# Patient Record
Sex: Female | Born: 1940 | Race: Black or African American | Hispanic: No | Smoking: Never smoker
Health system: Southern US, Community
[De-identification: ages and names within clinical notes are randomized; demographics above are authoritative.]

## PROBLEM LIST (undated history)

## (undated) DIAGNOSIS — M1990 Unspecified osteoarthritis, unspecified site: Secondary | ICD-10-CM

## (undated) DIAGNOSIS — C50912 Malignant neoplasm of unspecified site of left female breast: Secondary | ICD-10-CM

## (undated) DIAGNOSIS — G629 Polyneuropathy, unspecified: Secondary | ICD-10-CM

## (undated) DIAGNOSIS — E7800 Pure hypercholesterolemia, unspecified: Secondary | ICD-10-CM

## (undated) DIAGNOSIS — I509 Heart failure, unspecified: Secondary | ICD-10-CM

## (undated) DIAGNOSIS — I639 Cerebral infarction, unspecified: Secondary | ICD-10-CM

## (undated) DIAGNOSIS — E119 Type 2 diabetes mellitus without complications: Secondary | ICD-10-CM

## (undated) DIAGNOSIS — J449 Chronic obstructive pulmonary disease, unspecified: Secondary | ICD-10-CM

## (undated) DIAGNOSIS — Z9981 Dependence on supplemental oxygen: Secondary | ICD-10-CM

## (undated) DIAGNOSIS — I10 Essential (primary) hypertension: Secondary | ICD-10-CM

## (undated) DIAGNOSIS — K922 Gastrointestinal hemorrhage, unspecified: Secondary | ICD-10-CM

## (undated) HISTORY — PX: ABDOMINAL HYSTERECTOMY: SHX81

## (undated) HISTORY — PX: CATARACT EXTRACTION, BILATERAL: SHX1313

## (undated) HISTORY — PX: TONSILLECTOMY: SUR1361

## (undated) HISTORY — PX: MASTECTOMY: SHX3

## (undated) HISTORY — PX: BREAST BIOPSY: SHX20

---

## 2009-07-03 ENCOUNTER — Emergency Department (HOSPITAL_BASED_OUTPATIENT_CLINIC_OR_DEPARTMENT_OTHER): Admission: EM | Admit: 2009-07-03 | Discharge: 2009-07-04 | Payer: Self-pay | Admitting: Emergency Medicine

## 2009-07-04 ENCOUNTER — Ambulatory Visit: Payer: Self-pay | Admitting: Diagnostic Radiology

## 2010-05-16 LAB — POCT CARDIAC MARKERS
Myoglobin, poc: 66.3 ng/mL (ref 12–200)
Troponin i, poc: <0.05 ng/mL (ref 0.00–0.09)

## 2010-05-16 LAB — DIFFERENTIAL
Basophils Absolute: 0.2 10*3/uL — ABNORMAL HIGH (ref 0.0–0.1)
Basophils Relative: 3 % — ABNORMAL HIGH (ref 0–1)
Eosinophils Absolute: 0.1 10*3/uL (ref 0.0–0.7)
Lymphocytes Relative: 13 % (ref 12–46)

## 2010-05-16 LAB — CBC
HCT: 40.6 % (ref 36.0–46.0)
WBC: 6.4 10*3/uL (ref 4.0–10.5)

## 2010-05-16 LAB — BASIC METABOLIC PANEL
BUN: 24 mg/dL — ABNORMAL HIGH (ref 6–23)
CO2: 37 mEq/L — ABNORMAL HIGH (ref 19–32)
Calcium: 9 mg/dL (ref 8.4–10.5)
Creatinine, Ser: 0.9 mg/dL (ref 0.4–1.2)

## 2010-05-16 LAB — GLUCOSE, CAPILLARY: Glucose-Capillary: 137 mg/dL — ABNORMAL HIGH (ref 70–99)

## 2010-05-16 LAB — POCT B-TYPE NATRIURETIC PEPTIDE (BNP): B Natriuretic Peptide, POC: 13.9 pg/mL (ref 0–100)

## 2010-05-16 LAB — D-DIMER, QUANTITATIVE: D-Dimer, Quant: 0.72 ug/mL-FEU — ABNORMAL HIGH (ref 0.00–0.48)

## 2010-11-09 ENCOUNTER — Encounter: Payer: Self-pay | Admitting: *Deleted

## 2010-11-09 ENCOUNTER — Emergency Department (INDEPENDENT_AMBULATORY_CARE_PROVIDER_SITE_OTHER): Payer: Medicare Other

## 2010-11-09 ENCOUNTER — Other Ambulatory Visit: Payer: Self-pay

## 2010-11-09 ENCOUNTER — Emergency Department (HOSPITAL_BASED_OUTPATIENT_CLINIC_OR_DEPARTMENT_OTHER)
Admission: EM | Admit: 2010-11-09 | Discharge: 2010-11-09 | Disposition: A | Discharge status: Home / Self Care | Payer: Medicare Other | Attending: Emergency Medicine | Admitting: Emergency Medicine

## 2010-11-09 DIAGNOSIS — R0609 Other forms of dyspnea: Secondary | ICD-10-CM | POA: Insufficient documentation

## 2010-11-09 DIAGNOSIS — R0602 Shortness of breath: Secondary | ICD-10-CM

## 2010-11-09 DIAGNOSIS — I10 Essential (primary) hypertension: Secondary | ICD-10-CM | POA: Insufficient documentation

## 2010-11-09 DIAGNOSIS — R599 Enlarged lymph nodes, unspecified: Secondary | ICD-10-CM

## 2010-11-09 DIAGNOSIS — R0989 Other specified symptoms and signs involving the circulatory and respiratory systems: Secondary | ICD-10-CM

## 2010-11-09 DIAGNOSIS — J45909 Unspecified asthma, uncomplicated: Secondary | ICD-10-CM | POA: Insufficient documentation

## 2010-11-09 DIAGNOSIS — I517 Cardiomegaly: Secondary | ICD-10-CM

## 2010-11-09 DIAGNOSIS — M7989 Other specified soft tissue disorders: Secondary | ICD-10-CM | POA: Insufficient documentation

## 2010-11-09 DIAGNOSIS — Z853 Personal history of malignant neoplasm of breast: Secondary | ICD-10-CM | POA: Insufficient documentation

## 2010-11-09 DIAGNOSIS — E119 Type 2 diabetes mellitus without complications: Secondary | ICD-10-CM | POA: Insufficient documentation

## 2010-11-09 DIAGNOSIS — R609 Edema, unspecified: Secondary | ICD-10-CM

## 2010-11-09 HISTORY — DX: Essential (primary) hypertension: I10

## 2010-11-09 LAB — DIFFERENTIAL
Basophils Absolute: 0 10*3/uL (ref 0.0–0.1)
Basophils Relative: 0 % (ref 0–1)
Lymphocytes Relative: 14 % (ref 12–46)
Lymphs Abs: 1 10*3/uL (ref 0.7–4.0)
Monocytes Relative: 10 % (ref 3–12)
Neutro Abs: 5.2 10*3/uL (ref 1.7–7.7)

## 2010-11-09 LAB — CBC
MCH: 26.9 pg (ref 26.0–34.0)
RDW: 14.4 % (ref 11.5–15.5)
WBC: 7 10*3/uL (ref 4.0–10.5)

## 2010-11-09 LAB — BASIC METABOLIC PANEL
Calcium: 9.5 mg/dL (ref 8.4–10.5)
Creatinine, Ser: 0.9 mg/dL (ref 0.50–1.10)
Sodium: 141 mEq/L (ref 135–145)

## 2010-11-09 MED ORDER — IOHEXOL 350 MG/ML SOLN
80.0000 mL | Freq: Once | INTRAVENOUS | Status: AC | PRN
  Administered 2010-11-09: 80 mL via INTRAVENOUS

## 2010-11-09 NOTE — ED Notes (Signed)
Pt placed on 4l/m [place] with relief of sob. Continuous po. Placed on right index finger. sats 96% on 4LN/C.

## 2010-11-09 NOTE — ED Notes (Signed)
Pt c/o leg swelling that has gotten worse since last week. Pt sts she has seen her PMD for same but he didn't do anything for her or change her fluid pills. She sts her feet get darker in color aty night. Pt also reports a dizzy spell with SOB this a.m.

## 2010-11-09 NOTE — ED Notes (Signed)
Patient stated she is normally on home 02 @ 2ltrs/min. So nurse turned 02 down from 4 to 2 ltrs.

## 2010-11-09 NOTE — ED Provider Notes (Signed)
History     CSN: [id number] Arrival date & time: 11/09/2010  1:11 PM   Chief Complaint  Patient presents with  . Leg Swelling     (Include location/radiation/quality/duration/timing/severity/associated sxs/prior treatment) HPI   Past Medical History  Diagnosis Date  . Diabetes mellitus   . Hypertension   . Breast cancer   . Asthma      Past Surgical History  Procedure Date  . Cesarean section   . Abdominal hysterectomy   . Mastectomy     No family history on file.  History  Substance Use Topics  . Smoking status: Never Smoker   . Smokeless tobacco: Not on file  . Alcohol Use: No    OB History    Grav Para Term Preterm Abortions TAB SAB Ect Mult Living                  Review of Systems  Allergies  Review of patient's allergies indicates no known allergies.  Home Medications  No current outpatient prescriptions on file.  Physical Exam    BP 159/88  Pulse 82  Temp(Src) 98.9 F (37.2 C) (Oral)  Resp 21  SpO2 96%  Physical Exam  ED Course  Procedures  Results for orders placed during the hospital encounter of 11/09/10  CBC      Component Value Range   WBC 7.0  4.0 - 10.5 (K/uL)   RBC 4.53  3.87 - 5.11 (MIL/uL)   Hemoglobin 12.2  12.0 - 15.0 (g/dL)   HCT 30.8  65.7 - 84.6 (%)   MCV 88.7  78.0 - 100.0 (fL)   MCH 26.9  26.0 - 34.0 (pg)   MCHC 30.3  30.0 - 36.0 (g/dL)   RDW 96.2  95.2 - 84.1 (%)   Platelets 169  150 - 400 (K/uL)  DIFFERENTIAL      Component Value Range   Neutrophils Relative 74  43 - 77 (%)   Neutro Abs 5.2  1.7 - 7.7 (K/uL)   Lymphocytes Relative 14  12 - 46 (%)   Lymphs Abs 1.0  0.7 - 4.0 (K/uL)   Monocytes Relative 10  3 - 12 (%)   Monocytes Absolute 0.7  0.1 - 1.0 (K/uL)   Eosinophils Relative 1  0 - 5 (%)   Eosinophils Absolute 0.1  0.0 - 0.7 (K/uL)   Basophils Relative 0  0 - 1 (%)   Basophils Absolute 0.0  0.0 - 0.1 (K/uL)  BASIC METABOLIC PANEL      Component Value Range   Sodium 141  135 - 145 (mEq/L)   Potassium 4.7  3.5 - 5.1 (mEq/L)   Chloride 97  96 - 112 (mEq/L)   CO2 41 (*) 19 - 32 (mEq/L)   Glucose, Bld 98  70 - 99 (mg/dL)   BUN 19  6 - 23 (mg/dL)   Creatinine, Ser 3.24  0.50 - 1.10 (mg/dL)   Calcium 9.5  8.4 - 40.1 (mg/dL)   GFR calc non Af Amer >60  >60 (mL/min)   GFR calc Af Amer >60  >60 (mL/min)   Dg Chest 2 View  11/09/2010  *RADIOLOGY REPORT*  Clinical Data: Short of breath.  History breast cancer  CHEST - 2 VIEW  Comparison: CT 07/04/2009, chest radiograph 07/04/2009  Findings: Cardiac silhouette is enlarged compared to prior.  Aorta is ectatic.  There is increased fullness within the right hilum. Additionally, there is a 10  millimeter right suprahilar density. Lateral projection is hypoventilatory.  Degenerative  osteophytosis of the thoracic spine.  IMPRESSION:  1.  Increased fullness in the right hila and right suprahilar density.  Recommend CT thorax with contrast to evaluate for adenopathy or mass. 2.  Interval increase in cardiac silhouette.  Original Report Authenticated By: Genevive Bi, M.D.   Ct Angio Chest W/cm &/or Wo Cm  11/09/2010  *RADIOLOGY REPORT*  Clinical Data:  Dyspnea, history of breast cancer, evaluate for PE  CT ANGIOGRAPHY CHEST WITH CONTRAST  Technique:  Multidetector CT imaging of the chest was performed using the standard protocol during bolus administration of intravenous contrast.  Multiplanar CT image reconstructions including MIPs were obtained to evaluate the vascular anatomy.  Contrast:  80 ml Omnipaque-300 IV  Comparison:  Chest radiographs dated 11/09/2010.  CT chest dated 07/04/2009.  Findings:  No evidence of pulmonary embolism.  Minimal patchy atelectasis / scarring in the lingula and at the right lung base. Previously visualized pulmonary nodules suspicious for metastases have essentially resolved.  Three tiny 2-3 mm pulmonary nodules in the right upper lobe (series 6/image 48) right lower lobe (series 6/image 57) and left lung base (series  6/image 79).  No pleural effusion or pneumothorax.  The heart is mildly enlarged.  No pericardial effusion.  Right hilar/mediastinal lymphadenopathy, as follows: --2.5 x 1.8 cm right superior hilar node (series 4/image 42), previously 2.2 x 1.5 cm --1.3 x 1.9 cm inferior right hilar node (series 4/image 53), previously 1.3 x 2.0 cm --1.2 x 1.8 cm subcarinal lymph node (series 4/image 50), previously 1.3 x 2.0 cm  Visualized upper abdomen is unremarkable.  Degenerative changes of the visualized thoracolumbar spine with stable generalized sclerosis.  Review of the MIP images confirms the above findings.  IMPRESSION: No evidence of pulmonary embolism.  Mediastinal/right hilar lymphadenopathy, as described above, grossly unchanged from 06/2009.  Previously visualized pulmonary nodules suspicious for metastases have essentially resolved.  Original Report Authenticated By: Charline Bills, M.D.   US Venous Img Lower Unilateral Right  11/09/2010  *RADIOLOGY REPORT*  Clinical Data: Right leg swelling  RIGHT LOWER EXTREMITY VENOUS DUPLEX ULTRASOUND  Technique:  Gray-scale sonography with graded compression, as well as color Doppler and duplex ultrasound, were performed to evaluate the deep venous system of the lower extremity from the level of the common femoral vein through the popliteal and proximal calf veins. Spectral Doppler was utilized to evaluate flow at rest and with distal augmentation maneuvers.  Comparison:  None.  Findings: Visualized right lower extremity deep venous system appears patent.  Normal compressibility.  Patent color Doppler flow.  Satisfactory spectral Doppler with respiratory variation and response to augmentation.  Peroneal vein in the calf is not discretely seen.  Greater saphenous vein, where visualized, is patent and compressible.  IMPRESSION: No deep venous thrombosis in the visualized right lower extremity.  Original Report Authenticated By: Charline Bills, M.D.     1. Peripheral  edema      MDM Patient was awaiting CT results at time of shift turnover. Her CT does not demonstrate any acute pulmonary embolus or other acute pathology. The enlarged lymph nodes are actually decreased in size with the same size from a year ago. I discussed these results with the patient and she is in no respiratory distress feels well and will be discharged home.       Nat Christen, MD 11/09/10 1620

## 2010-11-09 NOTE — ED Notes (Signed)
Co 2 41 noted by nurse and MD.

## 2010-11-09 NOTE — ED Provider Notes (Addendum)
History     CSN: [id number] Arrival date & time: 11/09/2010  1:11 PM   Chief Complaint  Patient presents with  . Leg Swelling     (Include location/radiation/quality/duration/timing/severity/associated sxs/prior treatment) The history is provided by the patient.   Patient with a known history of diabetes hypertension breast cancer asthma and previous stroke with residual left-sided bodily weakness. Presents with essentially chronic swelling of the lower extremities she states she is on a water pill at home patient states she saw her primary physician at the [facility] 2 weeks ago. The patient furthermore states her legs are turning darker at night but appeared to be more normal to her now she denies any fever or erythema. The patient is on home oxygen but feels more short of breath today. She denies any chest pain or pleuritic pain.  Past Medical History  Diagnosis Date  . Diabetes mellitus   . Hypertension   . Breast cancer   . Asthma      Past Surgical History  Procedure Date  . Cesarean section   . Abdominal hysterectomy   . Mastectomy     No family history on file.  History  Substance Use Topics  . Smoking status: Never Smoker   . Smokeless tobacco: Not on file  . Alcohol Use: No    OB History    Grav Para Term Preterm Abortions TAB SAB Ect Mult Living                  Review of Systems  All other systems reviewed and are negative.    Allergies  Review of patient's allergies indicates no known allergies.  Home Medications  No current outpatient prescriptions on file.  Physical Exam    BP 139/82  Pulse 81  Temp(Src) 98.1 F (36.7 C) (Oral)  Resp 22  SpO2 84%  Physical Exam  Constitutional: She is oriented to person, place, and time. She appears well-developed and well-nourished.  HENT:  Head: Normocephalic and atraumatic.  Eyes: Conjunctivae and EOM are normal. Pupils are equal, round, and reactive to light.  Neck: Neck supple.    Cardiovascular: Normal rate and regular rhythm.  Exam reveals no gallop and no friction rub.   No murmur heard. Pulmonary/Chest: Breath sounds normal. No respiratory distress. She has no wheezes. She has no rales. She exhibits no tenderness.  Abdominal: Soft. Bowel sounds are normal. She exhibits no distension. There is no tenderness. There is no rebound and no guarding.  Musculoskeletal: Normal range of motion. She exhibits edema.       2+ edema to the bilateral ankles which appears chronic there is no erythema distal pulses are easily palpable. There is no calf tenderness. There does appear to be some changes of venous stasis dermatitis in the lower extremities as well.  Neurological: She is alert and oriented to person, place, and time. No cranial nerve deficit. Coordination normal.  Skin: Skin is warm and dry. No rash noted.  Psychiatric: She has a normal mood and affect.    ED Course  Procedures  Results for orders placed during the hospital encounter of 07/03/09  BASIC METABOLIC PANEL      Component Value Range   Sodium 147 (*) 135 - 145 (mEq/L)   Potassium 4.1  3.5 - 5.1 (mEq/L)   Chloride 101  96 - 112 (mEq/L)   CO2 37 (*) 19 - 32 (mEq/L)   Glucose, Bld 136 (*) 70 - 99 (mg/dL)   BUN 24 (*)  6 - 23 (mg/dL)   Creatinine, Ser .9  0.4 - 1.2 (mg/dL)   Calcium 9.0  8.4 - 16.1 (mg/dL)   GFR calc non Af Amer >60  >60 (mL/min)   GFR calc Af Amer    >60 (mL/min)   Value: >60            The eGFR has been calculated     using the MDRD equation.     This calculation has not been     validated in all clinical     situations.     eGFR's persistently     <60 mL/min signify     possible Chronic Kidney Disease.  CBC      Component Value Range   WBC 6.4  4.0 - 10.5 (K/uL)   RBC 4.65  3.87 - 5.11 (MIL/uL)   Hemoglobin 13.0  12.0 - 15.0 (g/dL)   HCT 09.6  04.5 - 40.9 (%)   MCV 87.3  78.0 - 100.0 (fL)   MCHC 32.0  30.0 - 36.0 (g/dL)   RDW 81.1  91.4 - 78.2 (%)   Platelets 204  150 -  400 (K/uL)  DIFFERENTIAL      Component Value Range   Neutrophils Relative 73  43 - 77 (%)   Neutro Abs 4.7  1.7 - 7.7 (K/uL)   Lymphocytes Relative 13  12 - 46 (%)   Lymphs Abs 0.8  0.7 - 4.0 (K/uL)   Monocytes Relative 9  3 - 12 (%)   Monocytes Absolute 0.6  0.1 - 1.0 (K/uL)   Eosinophils Relative 2  0 - 5 (%)   Eosinophils Absolute 0.1  0.0 - 0.7 (K/uL)   Basophils Relative 3 (*) 0 - 1 (%)   Basophils Absolute 0.2 (*) 0.0 - 0.1 (K/uL)  D-DIMER, QUANTITATIVE      Component Value Range   D-Dimer, Quant   (*) 0.00 - 0.48 (ug/mL-FEU)   Value: 0.72            AT THE INHOUSE ESTABLISHED CUTOFF     VALUE OF 0.48 ug/mL FEU,     THIS ASSAY HAS BEEN DOCUMENTED     IN THE LITERATURE TO HAVE     A SENSITIVITY AND NEGATIVE     PREDICTIVE VALUE OF AT LEAST     98 TO 99%.  THE TEST RESULT     SHOULD BE CORRELATED WITH     AN ASSESSMENT OF THE CLINICAL     PROBABILITY OF DVT / VTE.  GLUCOSE, CAPILLARY      Component Value Range   Glucose-Capillary 137 (*) 70 - 99 (mg/dL)  POCT B-TYPE NATRIURETIC PEPTIDE (BNP)      Component Value Range   B Natriuretic Peptide, POC 13.9  0 - 100 (pg/mL)  POCT CARDIAC MARKERS      Component Value Range   Myoglobin, poc 66.3  12 - 200 (ng/mL)   CKMB, poc 1.0  1.0 - 8.0 (ng/mL)   Troponin i, poc <0.05  0.00 - 0.09 (ng/mL)   Comment       Value:            TROPONIN VALUES IN THE RANGE     OF 0.00-0.09 ng/mL SHOW     NO INDICATION OF     MYOCARDIAL INJURY.                PERSISTENTLY INCREASED TROPONIN     VALUES IN THE RANGE OF 0.10-0.24  ng/mL CAN BE SEEN IN:           -UNSTABLE ANGINA           -CONGESTIVE HEART FAILURE           -MYOCARDITIS           -CHEST TRAUMA           -ARRYHTHMIAS           -LATE PRESENTING MI           -COPD       CLINICAL FOLLOW-UP RECOMMENDED.                TROPONIN VALUES >=0.25 ng/mL     INDICATE POSSIBLE MYOCARDIAL     ISCHEMIA. SERIAL TESTING     RECOMMENDED.   No results found.   No diagnosis  found.   MDM Pt is seen and examined;  Initial history and physical completed.  Will follow.         Tramel Westbrook A. Patrica Duel, MD 11/09/10 1331  Petro Talent A. Schuyler Olden, MD 11/09/10 1333   Date: 11/09/2010  Rate: 77  Rhythm: normal sinus rhythm and sinus arrhythmia  QRS Axis: normal  Intervals: normal  ST/T Wave abnormalities: normal  Conduction Disutrbances:none  Narrative Interpretation:   Old EKG Reviewed: none available  Results for orders placed during the hospital encounter of 11/09/10  CBC      Component Value Range   WBC 7.0  4.0 - 10.5 (K/uL)   RBC 4.53  3.87 - 5.11 (MIL/uL)   Hemoglobin 12.2  12.0 - 15.0 (g/dL)   HCT 45.4  09.8 - 11.9 (%)   MCV 88.7  78.0 - 100.0 (fL)   MCH 26.9  26.0 - 34.0 (pg)   MCHC 30.3  30.0 - 36.0 (g/dL)   RDW 14.7  82.9 - 56.2 (%)   Platelets 169  150 - 400 (K/uL)  DIFFERENTIAL      Component Value Range   Neutrophils Relative 74  43 - 77 (%)   Neutro Abs 5.2  1.7 - 7.7 (K/uL)   Lymphocytes Relative 14  12 - 46 (%)   Lymphs Abs 1.0  0.7 - 4.0 (K/uL)   Monocytes Relative 10  3 - 12 (%)   Monocytes Absolute 0.7  0.1 - 1.0 (K/uL)   Eosinophils Relative 1  0 - 5 (%)   Eosinophils Absolute 0.1  0.0 - 0.7 (K/uL)   Basophils Relative 0  0 - 1 (%)   Basophils Absolute 0.0  0.0 - 0.1 (K/uL)  BASIC METABOLIC PANEL      Component Value Range   Sodium 141  135 - 145 (mEq/L)   Potassium 4.7  3.5 - 5.1 (mEq/L)   Chloride 97  96 - 112 (mEq/L)   CO2 41 (*) 19 - 32 (mEq/L)   Glucose, Bld 98  70 - 99 (mg/dL)   BUN 19  6 - 23 (mg/dL)   Creatinine, Ser 1.30  0.50 - 1.10 (mg/dL)   Calcium 9.5  8.4 - 86.5 (mg/dL)   GFR calc non Af Amer >60  >60 (mL/min)   GFR calc Af Amer >60  >60 (mL/min)   Dg Chest 2 View  11/09/2010  *RADIOLOGY REPORT*  Clinical Data: Short of breath.  History breast cancer  CHEST - 2 VIEW  Comparison: CT 07/04/2009, chest radiograph 07/04/2009  Findings: Cardiac silhouette is enlarged compared to prior.  Aorta is ectatic.  There is  increased fullness within the right hilum. Additionally,  there is a 10  millimeter right suprahilar density. Lateral projection is hypoventilatory.  Degenerative osteophytosis of the thoracic spine.  IMPRESSION:  1.  Increased fullness in the right hila and right suprahilar density.  Recommend CT thorax with contrast to evaluate for adenopathy or mass. 2.  Interval increase in cardiac silhouette.  Original Report Authenticated By: Genevive Bi, M.D.    2:32 PM Chest x-ray reviewed. CT angiogram of chest has been ordered for further evaluation. DVT of the right lower extremity is negative.   Kiarrah Rausch A. Patrica Duel, MD 11/09/10 1434  Results for orders placed during the hospital encounter of 11/09/10  CBC      Component Value Range   WBC 7.0  4.0 - 10.5 (K/uL)   RBC 4.53  3.87 - 5.11 (MIL/uL)   Hemoglobin 12.2  12.0 - 15.0 (g/dL)   HCT 40.9  81.1 - 91.4 (%)   MCV 88.7  78.0 - 100.0 (fL)   MCH 26.9  26.0 - 34.0 (pg)   MCHC 30.3  30.0 - 36.0 (g/dL)   RDW 78.2  95.6 - 21.3 (%)   Platelets 169  150 - 400 (K/uL)  DIFFERENTIAL      Component Value Range   Neutrophils Relative 74  43 - 77 (%)   Neutro Abs 5.2  1.7 - 7.7 (K/uL)   Lymphocytes Relative 14  12 - 46 (%)   Lymphs Abs 1.0  0.7 - 4.0 (K/uL)   Monocytes Relative 10  3 - 12 (%)   Monocytes Absolute 0.7  0.1 - 1.0 (K/uL)   Eosinophils Relative 1  0 - 5 (%)   Eosinophils Absolute 0.1  0.0 - 0.7 (K/uL)   Basophils Relative 0  0 - 1 (%)   Basophils Absolute 0.0  0.0 - 0.1 (K/uL)  BASIC METABOLIC PANEL      Component Value Range   Sodium 141  135 - 145 (mEq/L)   Potassium 4.7  3.5 - 5.1 (mEq/L)   Chloride 97  96 - 112 (mEq/L)   CO2 41 (*) 19 - 32 (mEq/L)   Glucose, Bld 98  70 - 99 (mg/dL)   BUN 19  6 - 23 (mg/dL)   Creatinine, Ser 0.86  0.50 - 1.10 (mg/dL)   Calcium 9.5  8.4 - 57.8 (mg/dL)   GFR calc non Af Amer >60  >60 (mL/min)   GFR calc Af Amer >60  >60 (mL/min)   Dg Chest 2 View  11/09/2010  *RADIOLOGY REPORT*  Clinical  Data: Short of breath.  History breast cancer  CHEST - 2 VIEW  Comparison: CT 07/04/2009, chest radiograph 07/04/2009  Findings: Cardiac silhouette is enlarged compared to prior.  Aorta is ectatic.  There is increased fullness within the right hilum. Additionally, there is a 10  millimeter right suprahilar density. Lateral projection is hypoventilatory.  Degenerative osteophytosis of the thoracic spine.  IMPRESSION:  1.  Increased fullness in the right hila and right suprahilar density.  Recommend CT thorax with contrast to evaluate for adenopathy or mass. 2.  Interval increase in cardiac silhouette.  Original Report Authenticated By: Genevive Bi, M.D.   US Venous Img Lower Unilateral Right  11/09/2010  *RADIOLOGY REPORT*  Clinical Data: Right leg swelling  RIGHT LOWER EXTREMITY VENOUS DUPLEX ULTRASOUND  Technique:  Gray-scale sonography with graded compression, as well as color Doppler and duplex ultrasound, were performed to evaluate the deep venous system of the lower extremity from the level of the common femoral vein through the popliteal and  proximal calf veins. Spectral Doppler was utilized to evaluate flow at rest and with distal augmentation maneuvers.  Comparison:  None.  Findings: Visualized right lower extremity deep venous system appears patent.  Normal compressibility.  Patent color Doppler flow.  Satisfactory spectral Doppler with respiratory variation and response to augmentation.  Peroneal vein in the calf is not discretely seen.  Greater saphenous vein, where visualized, is patent and compressible.  IMPRESSION: No deep venous thrombosis in the visualized right lower extremity.  Original Report Authenticated By: Charline Bills, M.D.      Izacc Demeyer A. Patrica Duel, MD 11/13/10 [phone]

## 2015-09-13 ENCOUNTER — Encounter (HOSPITAL_BASED_OUTPATIENT_CLINIC_OR_DEPARTMENT_OTHER): Payer: Self-pay | Admitting: *Deleted

## 2015-09-13 ENCOUNTER — Observation Stay (HOSPITAL_BASED_OUTPATIENT_CLINIC_OR_DEPARTMENT_OTHER)
Admission: EM | Admit: 2015-09-13 | Discharge: 2015-09-15 | Disposition: A | Discharge status: Home / Self Care | Payer: Medicare Other | Attending: Internal Medicine | Admitting: Internal Medicine

## 2015-09-13 ENCOUNTER — Emergency Department (HOSPITAL_BASED_OUTPATIENT_CLINIC_OR_DEPARTMENT_OTHER): Payer: Medicare Other

## 2015-09-13 DIAGNOSIS — Z7984 Long term (current) use of oral hypoglycemic drugs: Secondary | ICD-10-CM | POA: Insufficient documentation

## 2015-09-13 DIAGNOSIS — I130 Hypertensive heart and chronic kidney disease with heart failure and stage 1 through stage 4 chronic kidney disease, or unspecified chronic kidney disease: Secondary | ICD-10-CM | POA: Diagnosis not present

## 2015-09-13 DIAGNOSIS — Z6838 Body mass index (BMI) 38.0-38.9, adult: Secondary | ICD-10-CM | POA: Insufficient documentation

## 2015-09-13 DIAGNOSIS — E1122 Type 2 diabetes mellitus with diabetic chronic kidney disease: Secondary | ICD-10-CM | POA: Insufficient documentation

## 2015-09-13 DIAGNOSIS — J449 Chronic obstructive pulmonary disease, unspecified: Secondary | ICD-10-CM | POA: Diagnosis not present

## 2015-09-13 DIAGNOSIS — N183 Chronic kidney disease, stage 3 (moderate): Secondary | ICD-10-CM | POA: Insufficient documentation

## 2015-09-13 DIAGNOSIS — K922 Gastrointestinal hemorrhage, unspecified: Secondary | ICD-10-CM | POA: Diagnosis present

## 2015-09-13 DIAGNOSIS — Z7982 Long term (current) use of aspirin: Secondary | ICD-10-CM | POA: Insufficient documentation

## 2015-09-13 DIAGNOSIS — K921 Melena: Secondary | ICD-10-CM | POA: Diagnosis not present

## 2015-09-13 DIAGNOSIS — D649 Anemia, unspecified: Secondary | ICD-10-CM | POA: Diagnosis not present

## 2015-09-13 DIAGNOSIS — E669 Obesity, unspecified: Secondary | ICD-10-CM | POA: Diagnosis not present

## 2015-09-13 DIAGNOSIS — E1140 Type 2 diabetes mellitus with diabetic neuropathy, unspecified: Secondary | ICD-10-CM | POA: Insufficient documentation

## 2015-09-13 DIAGNOSIS — I10 Essential (primary) hypertension: Secondary | ICD-10-CM | POA: Diagnosis present

## 2015-09-13 DIAGNOSIS — R197 Diarrhea, unspecified: Secondary | ICD-10-CM | POA: Insufficient documentation

## 2015-09-13 DIAGNOSIS — Z853 Personal history of malignant neoplasm of breast: Secondary | ICD-10-CM | POA: Diagnosis not present

## 2015-09-13 DIAGNOSIS — I509 Heart failure, unspecified: Secondary | ICD-10-CM | POA: Diagnosis not present

## 2015-09-13 DIAGNOSIS — Z8719 Personal history of other diseases of the digestive system: Secondary | ICD-10-CM

## 2015-09-13 DIAGNOSIS — E1169 Type 2 diabetes mellitus with other specified complication: Secondary | ICD-10-CM

## 2015-09-13 HISTORY — DX: Type 2 diabetes mellitus without complications: E11.9

## 2015-09-13 HISTORY — DX: Heart failure, unspecified: I50.9

## 2015-09-13 HISTORY — DX: Gastrointestinal hemorrhage, unspecified: K92.2

## 2015-09-13 HISTORY — DX: Malignant neoplasm of unspecified site of left female breast: C50.912

## 2015-09-13 HISTORY — DX: Polyneuropathy, unspecified: G62.9

## 2015-09-13 HISTORY — DX: Dependence on supplemental oxygen: Z99.81

## 2015-09-13 HISTORY — DX: Pure hypercholesterolemia, unspecified: E78.00

## 2015-09-13 HISTORY — DX: Chronic obstructive pulmonary disease, unspecified: J44.9

## 2015-09-13 HISTORY — DX: Unspecified osteoarthritis, unspecified site: M19.90

## 2015-09-13 HISTORY — DX: Cerebral infarction, unspecified: I63.9

## 2015-09-13 MED ORDER — SODIUM CHLORIDE 0.9 % IV BOLUS (SEPSIS)
500.0000 mL | Freq: Once | INTRAVENOUS | Status: AC
  Administered 2015-09-14: 500 mL via INTRAVENOUS

## 2015-09-13 NOTE — ED Notes (Signed)
Rectal bleeding x 4 hours. Bright red blood. Daughter states she was incontinent of stool earlier today that was not bloody. Denies abdominal pain.

## 2015-09-13 NOTE — ED Provider Notes (Signed)
CSN: [id number]     Arrival date & time 09/13/15  2244 History  By signing my name below, I, Rayna Sexton, attest that this documentation has been prepared under the direction and in the presence of Alithea Lapage, MD. Electronically Signed: Rayna Sexton, ED Scribe. 09/13/2015. 11:19 PM.   Chief Complaint  Patient presents with  . Rectal Bleeding   Patient is a 75 y.o. female presenting with hematochezia. The history is provided by the patient and a relative. No language interpreter was used.  Rectal Bleeding Quality:  Bright red Amount:  Unable to specify Duration:  4 hours Timing:  Intermittent Progression:  Unchanged Chronicity:  New Context: diarrhea   Context: not constipation and not hemorrhoids   Similar prior episodes: no   Relieved by:  None tried Worsened by:  Nothing tried Ineffective treatments:  None tried Associated symptoms: no abdominal pain, no fever and no vomiting    HPI Comments: Kaitlyn Parrish is a 75 y.o. female who presents to the Emergency Department complaining of moderate rectal bleeding x 4 hours. Pt states that she was urinating and noticed just blood coming from her rectum. She states she had a BM earlier in the day which was nml and her daughter states that she experienced 1x incontinence of her bowels earlier today which her daughter states was loose. Pt denies a hx of similar symptoms or any known current hemorrhoids. Pt denies any new medications although her daughter states she recently was taken off of her valsartan-hydrochlorothiazide. She states her most recent colonoscopy was greater than 10 years ago. She denies n/v, abd pain, fevers, dysuria and hematuria.   Past Medical History  Diagnosis Date  . Diabetes mellitus   . Hypertension   . Breast cancer ([place])   . Asthma   . COPD (chronic obstructive pulmonary disease) ([place])   . CHF (congestive heart failure) ([place])   . Neuropathy [facility])    Past Surgical History  Procedure Laterality Date  .  Cesarean section    . Abdominal hysterectomy    . Mastectomy     No family history on file. Social History  Substance Use Topics  . Smoking status: Never Smoker   . Smokeless tobacco: None  . Alcohol Use: No   OB History    No data available     Review of Systems  Constitutional: Negative for fever.  Gastrointestinal: Positive for hematochezia and anal bleeding. Negative for nausea, vomiting, abdominal pain and blood in stool.  Genitourinary: Negative for dysuria and hematuria.  All other systems reviewed and are negative.   Allergies  Review of patient's allergies indicates no known allergies.  Home Medications   Prior to Admission medications   Not on File   BP 184/92 mmHg  Pulse 87  Temp(Src) 98.1 F (36.7 C) (Oral)  Resp 20  Ht 5\' 6"  (1.676 m)  Wt 242 lb (109.77 kg)  BMI 39.08 kg/m2  SpO2 94%   Physical Exam  Constitutional: She is oriented to person, place, and time. She appears well-developed and well-nourished. No distress.  HENT:  Head: Normocephalic and atraumatic.  Mouth/Throat: Oropharynx is clear and moist. No oropharyngeal exudate.  No stridor; no bruits; trachea midline  Eyes: Conjunctivae and EOM are normal. Pupils are equal, round, and reactive to light.  Neck: Normal range of motion. Neck supple.  Cardiovascular: Normal rate, regular rhythm and normal heart sounds.  Exam reveals no gallop and no friction rub.   No murmur heard. Pulmonary/Chest: Effort normal and breath  sounds normal. No respiratory distress. She has no wheezes. She has no rales.  Abdominal: Soft. She exhibits no mass. Bowel sounds are increased. There is no tenderness. There is no rebound and no guarding.  Genitourinary: Guaiac positive stool.  Bleeding hemorrhoid noted, dark stool on card  Musculoskeletal: Normal range of motion.  Neurological: She is alert and oriented to person, place, and time.  Skin: Skin is warm and dry. No pallor.  Psychiatric: She has a normal mood and  affect.  Nursing note and vitals reviewed.  ED Course  Procedures  DIAGNOSTIC STUDIES: Oxygen Saturation is 94% on RA, normal by my interpretation.    COORDINATION OF CARE: 11:16 PM Discussed next steps with pt. Pt verbalized understanding and is agreeable with the plan.   Labs Review Labs Reviewed - No data to display  Imaging Review No results found. I have personally reviewed and evaluated these images and lab results as part of my medical decision-making.   EKG Interpretation None      MDM   Final diagnoses:  None   Filed Vitals:   09/14/15 0002 09/14/15 0018  BP: 134/93 125/65  Pulse: 78 76  Temp:    Resp:  18   Results for orders placed or performed during the hospital encounter of 09/13/15  CBC with Differential/Platelet  Result Value Ref Range   WBC 7.3 4.0 - 10.5 K/uL   RBC 4.13 3.87 - 5.11 MIL/uL   Hemoglobin 11.6 (L) 12.0 - 15.0 g/dL   HCT 36.7 36.0 - 46.0 %   MCV 88.9 78.0 - 100.0 fL   MCH 28.1 26.0 - 34.0 pg   MCHC 31.6 30.0 - 36.0 g/dL   RDW 13.7 11.5 - 15.5 %   Platelets 185 150 - 400 K/uL   Neutrophils Relative % 74 %   Neutro Abs 5.4 1.7 - 7.7 K/uL   Lymphocytes Relative 15 %   Lymphs Abs 1.1 0.7 - 4.0 K/uL   Monocytes Relative 9 %   Monocytes Absolute 0.7 0.1 - 1.0 K/uL   Eosinophils Relative 2 %   Eosinophils Absolute 0.2 0.0 - 0.7 K/uL   Basophils Relative 0 %   Basophils Absolute 0.0 0.0 - 0.1 K/uL  Comprehensive metabolic panel  Result Value Ref Range   Sodium 140 135 - 145 mmol/L   Potassium 3.8 3.5 - 5.1 mmol/L   Chloride 103 101 - 111 mmol/L   CO2 30 22 - 32 mmol/L   Glucose, Bld 138 (H) 65 - 99 mg/dL   BUN 27 (H) 6 - 20 mg/dL   Creatinine, Ser 1.35 (H) 0.44 - 1.00 mg/dL   Calcium 9.0 8.9 - 10.3 mg/dL   Total Protein 7.3 6.5 - 8.1 g/dL   Albumin 3.6 3.5 - 5.0 g/dL   AST 16 15 - 41 U/L   ALT 11 (L) 14 - 54 U/L   Alkaline Phosphatase 90 38 - 126 U/L   Total Bilirubin 0.3 0.3 - 1.2 mg/dL   GFR calc non Af Amer 37 (L) >60  mL/min   GFR calc Af Amer 43 (L) >60 mL/min   Anion gap 7 5 - 15  Urinalysis, Routine w reflex microscopic (not at [facility])  Result Value Ref Range   Color, Urine YELLOW YELLOW   APPearance CLEAR CLEAR   Specific Gravity, Urine 1.011 1.005 - 1.030   pH 8.0 5.0 - 8.0   Glucose, UA NEGATIVE NEGATIVE mg/dL   Hgb urine dipstick LARGE (A) NEGATIVE   Bilirubin Urine  NEGATIVE NEGATIVE   Ketones, ur NEGATIVE NEGATIVE mg/dL   Protein, ur NEGATIVE NEGATIVE mg/dL   Nitrite NEGATIVE NEGATIVE   Leukocytes, UA MODERATE (A) NEGATIVE  Occult blood card to lab, stool Provider will collect  Result Value Ref Range   Fecal Occult Bld POSITIVE (A) NEGATIVE  Urine microscopic-add on  Result Value Ref Range   Squamous Epithelial / LPF 0-5 (A) NONE SEEN   WBC, UA 0-5 0 - 5 WBC/hpf   RBC / HPF 0-5 0 - 5 RBC/hpf   Bacteria, UA FEW (A) NONE SEEN   Dg Abd Acute W/chest  09/14/2015  CLINICAL DATA:  Moderate rectal bleeding EXAM: DG ABDOMEN ACUTE W/ 1V CHEST COMPARISON:  Jul 11, 2015 chest x-ray FINDINGS: Stable cardiomegaly. The hila and mediastinum are unchanged. No pneumothorax. No pulmonary nodules, masses, or focal infiltrates. No free air, portal venous gas, or pneumatosis. No evidence of bowel obstruction. No interval change in the bones. IMPRESSION: No acute abnormalities. Electronically Signed   By: Dorise Bullion III M.D   On: 09/14/2015 00:12    Medications  famotidine (PEPCID) IVPB 20 mg premix (not administered)  famotidine (PEPCID) 20-0.9 MG/50ML-% IVPB (not administered)  sodium chloride 0.9 % bolus 500 mL (500 mLs Intravenous New Bag/Given 09/14/15 0020)    Will admit to obs tele per Dr. Hal Hope   I personally performed the services described in this documentation, which was scribed in my presence. The recorded information has been reviewed and is accurate.      Veatrice Kells, MD 09/14/15 [phone]

## 2015-09-14 ENCOUNTER — Encounter (HOSPITAL_BASED_OUTPATIENT_CLINIC_OR_DEPARTMENT_OTHER): Payer: Self-pay | Admitting: Emergency Medicine

## 2015-09-14 DIAGNOSIS — J449 Chronic obstructive pulmonary disease, unspecified: Secondary | ICD-10-CM | POA: Diagnosis present

## 2015-09-14 DIAGNOSIS — E669 Obesity, unspecified: Secondary | ICD-10-CM

## 2015-09-14 DIAGNOSIS — K922 Gastrointestinal hemorrhage, unspecified: Secondary | ICD-10-CM | POA: Diagnosis not present

## 2015-09-14 DIAGNOSIS — Z853 Personal history of malignant neoplasm of breast: Secondary | ICD-10-CM

## 2015-09-14 DIAGNOSIS — D649 Anemia, unspecified: Secondary | ICD-10-CM | POA: Diagnosis present

## 2015-09-14 DIAGNOSIS — E1169 Type 2 diabetes mellitus with other specified complication: Secondary | ICD-10-CM

## 2015-09-14 DIAGNOSIS — K921 Melena: Secondary | ICD-10-CM | POA: Diagnosis not present

## 2015-09-14 DIAGNOSIS — I10 Essential (primary) hypertension: Secondary | ICD-10-CM | POA: Diagnosis present

## 2015-09-14 LAB — COMPREHENSIVE METABOLIC PANEL
ALBUMIN: 3.6 g/dL (ref 3.5–5.0)
ALT: 11 U/L — AB (ref 14–54)
AST: 16 U/L (ref 15–41)
Alkaline Phosphatase: 90 U/L (ref 38–126)
Anion gap: 7 (ref 5–15)
BILIRUBIN TOTAL: 0.3 mg/dL (ref 0.3–1.2)
BUN: 27 mg/dL — AB (ref 6–20)
CHLORIDE: 103 mmol/L (ref 101–111)
CO2: 30 mmol/L (ref 22–32)
CREATININE: 1.35 mg/dL — AB (ref 0.44–1.00)
Calcium: 9 mg/dL (ref 8.9–10.3)
GFR calc Af Amer: 43 mL/min — ABNORMAL LOW (ref >60)
GFR, EST NON AFRICAN AMERICAN: 37 mL/min — AB (ref >60)
GLUCOSE: 138 mg/dL — AB (ref 65–99)
Potassium: 3.8 mmol/L (ref 3.5–5.1)
Sodium: 140 mmol/L (ref 135–145)
Total Protein: 7.3 g/dL (ref 6.5–8.1)

## 2015-09-14 LAB — CBC
HCT: 37.1 % (ref 36.0–46.0)
HEMATOCRIT: 35.7 % — AB (ref 36.0–46.0)
HEMATOCRIT: 36.4 % (ref 36.0–46.0)
HEMOGLOBIN: 11 g/dL — AB (ref 12.0–15.0)
HEMOGLOBIN: 11.1 g/dL — AB (ref 12.0–15.0)
Hemoglobin: 11.3 g/dL — ABNORMAL LOW (ref 12.0–15.0)
MCH: 27.3 pg (ref 26.0–34.0)
MCH: 26.8 pg (ref 26.0–34.0)
MCH: 27.6 pg (ref 26.0–34.0)
MCHC: 30.8 g/dL (ref 30.0–36.0)
MCHC: 29.9 g/dL — AB (ref 30.0–36.0)
MCHC: 31 g/dL (ref 30.0–36.0)
MCV: 88.6 fL (ref 78.0–100.0)
MCV: 89.6 fL (ref 78.0–100.0)
MCV: 89 fL (ref 78.0–100.0)
PLATELETS: 189 10*3/uL (ref 150–400)
Platelets: 184 10*3/uL (ref 150–400)
Platelets: 176 10*3/uL (ref 150–400)
RBC: 4.03 MIL/uL (ref 3.87–5.11)
RBC: 4.14 MIL/uL (ref 3.87–5.11)
RBC: 4.09 MIL/uL (ref 3.87–5.11)
RDW: 13.8 % (ref 11.5–15.5)
RDW: 13.9 % (ref 11.5–15.5)
RDW: 13.9 % (ref 11.5–15.5)
WBC: 6.7 10*3/uL (ref 4.0–10.5)
WBC: 6.2 10*3/uL (ref 4.0–10.5)
WBC: 5.7 10*3/uL (ref 4.0–10.5)

## 2015-09-14 LAB — GLUCOSE, CAPILLARY
GLUCOSE-CAPILLARY: 117 mg/dL — AB (ref 65–99)
GLUCOSE-CAPILLARY: 98 mg/dL (ref 65–99)
GLUCOSE-CAPILLARY: 89 mg/dL (ref 65–99)
GLUCOSE-CAPILLARY: 123 mg/dL — AB (ref 65–99)
Glucose-Capillary: 204 mg/dL — ABNORMAL HIGH (ref 65–99)

## 2015-09-14 LAB — BASIC METABOLIC PANEL
Anion gap: 8 (ref 5–15)
BUN: 22 mg/dL — AB (ref 6–20)
CHLORIDE: 105 mmol/L (ref 101–111)
CO2: 29 mmol/L (ref 22–32)
CREATININE: 1.24 mg/dL — AB (ref 0.44–1.00)
Calcium: 8.9 mg/dL (ref 8.9–10.3)
GFR calc non Af Amer: 41 mL/min — ABNORMAL LOW (ref >60)
GFR, EST AFRICAN AMERICAN: 48 mL/min — AB (ref >60)
GLUCOSE: 126 mg/dL — AB (ref 65–99)
Potassium: 3.5 mmol/L (ref 3.5–5.1)
Sodium: 142 mmol/L (ref 135–145)

## 2015-09-14 LAB — TYPE AND SCREEN
ABO/RH(D): O POS
ANTIBODY SCREEN: NEGATIVE

## 2015-09-14 LAB — CBC WITH DIFFERENTIAL/PLATELET
BASOS ABS: 0 10*3/uL (ref 0.0–0.1)
BASOS PCT: 0 %
Eosinophils Absolute: 0.2 10*3/uL (ref 0.0–0.7)
Eosinophils Relative: 2 %
HEMATOCRIT: 36.7 % (ref 36.0–46.0)
Hemoglobin: 11.6 g/dL — ABNORMAL LOW (ref 12.0–15.0)
LYMPHS PCT: 15 %
Lymphs Abs: 1.1 10*3/uL (ref 0.7–4.0)
MCH: 28.1 pg (ref 26.0–34.0)
MCHC: 31.6 g/dL (ref 30.0–36.0)
MCV: 88.9 fL (ref 78.0–100.0)
Monocytes Absolute: 0.7 10*3/uL (ref 0.1–1.0)
Monocytes Relative: 9 %
NEUTROS ABS: 5.4 10*3/uL (ref 1.7–7.7)
NEUTROS PCT: 74 %
Platelets: 185 10*3/uL (ref 150–400)
RBC: 4.13 MIL/uL (ref 3.87–5.11)
RDW: 13.7 % (ref 11.5–15.5)
WBC: 7.3 10*3/uL (ref 4.0–10.5)

## 2015-09-14 LAB — URINE MICROSCOPIC-ADD ON

## 2015-09-14 LAB — OCCULT BLOOD X 1 CARD TO LAB, STOOL: FECAL OCCULT BLD: POSITIVE — AB

## 2015-09-14 LAB — URINALYSIS, ROUTINE W REFLEX MICROSCOPIC
BILIRUBIN URINE: NEGATIVE
GLUCOSE, UA: NEGATIVE mg/dL
KETONES UR: NEGATIVE mg/dL
Nitrite: NEGATIVE
PROTEIN: NEGATIVE mg/dL
SPECIFIC GRAVITY, URINE: 1.011 (ref 1.005–1.030)
pH: 8 (ref 5.0–8.0)

## 2015-09-14 LAB — ABO/RH: ABO/RH(D): O POS

## 2015-09-14 MED ORDER — ONDANSETRON HCL 4 MG PO TABS: 4.0000 mg | ORAL_TABLET | Freq: Four times a day (QID) | ORAL | Status: DC | PRN

## 2015-09-14 MED ORDER — METFORMIN HCL 500 MG PO TABS
500.0000 mg | ORAL_TABLET | Freq: Two times a day (BID) | ORAL | Status: DC
  Administered 2015-09-14 – 2015-09-15 (×2): 500 mg via ORAL
  Filled 2015-09-14 (×2): qty 1

## 2015-09-14 MED ORDER — FAMOTIDINE IN NACL 20-0.9 MG/50ML-% IV SOLN
20.0000 mg | Freq: Two times a day (BID) | INTRAVENOUS | Status: DC
  Administered 2015-09-14 – 2015-09-15 (×3): 20 mg via INTRAVENOUS
  Filled 2015-09-14 (×4): qty 50

## 2015-09-14 MED ORDER — BUDESONIDE-FORMOTEROL FUMARATE 80-4.5 MCG/ACT IN AERO
2.0000 | INHALATION_SPRAY | Freq: Two times a day (BID) | RESPIRATORY_TRACT | Status: DC
  Administered 2015-09-14 – 2015-09-15 (×3): 2 via RESPIRATORY_TRACT
  Filled 2015-09-14: qty 6.9

## 2015-09-14 MED ORDER — INSULIN ASPART 100 UNIT/ML ~~LOC~~ SOLN
0.0000 [IU] | SUBCUTANEOUS | Status: DC
  Administered 2015-09-14: 3 [IU] via SUBCUTANEOUS
  Administered 2015-09-15 (×2): 2 [IU] via SUBCUTANEOUS
  Administered 2015-09-15: 1 [IU] via SUBCUTANEOUS

## 2015-09-14 MED ORDER — ONDANSETRON HCL 4 MG/2ML IJ SOLN: 4.0000 mg | Freq: Four times a day (QID) | INTRAMUSCULAR | Status: DC | PRN

## 2015-09-14 MED ORDER — FAMOTIDINE IN NACL 20-0.9 MG/50ML-% IV SOLN
INTRAVENOUS | Status: AC
  Filled 2015-09-14: qty 50

## 2015-09-14 MED ORDER — HYDRALAZINE HCL 20 MG/ML IJ SOLN: 10.0000 mg | INTRAMUSCULAR | Status: DC | PRN

## 2015-09-14 MED ORDER — ACETAMINOPHEN 325 MG PO TABS
650.0000 mg | ORAL_TABLET | Freq: Four times a day (QID) | ORAL | Status: DC | PRN
  Administered 2015-09-15: 650 mg via ORAL
  Filled 2015-09-14: qty 2

## 2015-09-14 MED ORDER — SODIUM CHLORIDE 0.9 % IV SOLN
INTRAVENOUS | Status: DC
  Administered 2015-09-14: 06:00:00 via INTRAVENOUS

## 2015-09-14 MED ORDER — ACETAMINOPHEN 650 MG RE SUPP: 650.0000 mg | Freq: Four times a day (QID) | RECTAL | Status: DC | PRN

## 2015-09-14 MED ORDER — SODIUM CHLORIDE 0.9 % IV SOLN
INTRAVENOUS | Status: DC
  Administered 2015-09-14: 04:00:00 via INTRAVENOUS

## 2015-09-14 MED ORDER — IRBESARTAN 150 MG PO TABS
150.0000 mg | ORAL_TABLET | Freq: Every day | ORAL | Status: DC
  Administered 2015-09-15: 150 mg via ORAL
  Filled 2015-09-14: qty 1

## 2015-09-14 NOTE — Progress Notes (Signed)
Pt. Admitted from [place] via stretcher to 2W- 19; alert and oriented x4; up with assist to [facility]; had a few spots of bright blood after voiding when pt. wiped rectal area. Skin intact with swelling lower legs and little darken.

## 2015-09-14 NOTE — Care Management Obs Status (Signed)
[facility] NOTIFICATION   Patient Details  Name: Leonia Arman MRN: [id number] Date of Birth: Aug 03, 1940   Medicare Observation Status Notification Given:  Yes    Dawayne Patricia, RN 09/14/2015, 10:53 AM

## 2015-09-14 NOTE — H&P (Signed)
History and Physical    Kaitlyn Parrish [id number] DOB: [birth year] DOA: 09/13/2015  PCP: No primary care provider on file. Patient coming from: [facility].  Chief Complaint: Rectal bleeding.  HPI: Kaitlyn Parrish is a 75 y.o. female with diabetes mellitus type 2, chronic kidney disease stage III, chronic anemia, hypertension, CHF, breast cancer in remission presented to the ER at [facility] with complaints of having rectal bleeding. Patient started developing painless rectal bleeding since yesterday and had at least 4 episodes as per the patient's daughter. Patient had frank rectal bleeding. Denies any abdominal pain nausea vomiting. Patient takes aspirin. Denies taking any NSAIDs. Patient's hemoglobin is around 11 which is her baseline when compared to the old one in care everywhere. Patient states she has never had any GI bleed previously. Has had colonoscopy at least 10 years ago which as per the patient was normal. Patient is presently hemodynamically stable and has been admitted for further management of rectal bleed.   ED Course: As per the history of present illness.  Review of Systems: As per HPI, rest all negative.   Past Medical History  Diagnosis Date  . Diabetes mellitus   . Hypertension   . Breast cancer ([place])   . Asthma   . COPD (chronic obstructive pulmonary disease) ([place])   . CHF (congestive heart failure) ([place])   . Neuropathy [facility])     Past Surgical History  Procedure Laterality Date  . Cesarean section    . Abdominal hysterectomy    . Mastectomy       reports that she has never smoked. She does not have any smokeless tobacco history on file. She reports that she does not drink alcohol or use illicit drugs.  No Known Allergies  Family History  Problem Relation Age of Onset  . Throat cancer Father   . Diabetes Mellitus II Sister     Prior to Admission medications   Medication Sig Start Date End Date Taking? Authorizing Provider  ALBUTEROL  IN Inhale into the lungs.   Yes Historical Provider, MD  amLODipine (NORVASC) 2.5 MG tablet Take 2.5 mg by mouth daily.   Yes Historical Provider, MD  aspirin 81 MG tablet Take 81 mg by mouth daily.   Yes Historical Provider, MD  Budesonide-Formoterol Fumarate (SYMBICORT IN) Inhale into the lungs.   Yes Historical Provider, MD  furosemide (LASIX) 80 MG tablet Take 80 mg by mouth.   Yes Historical Provider, MD  glimepiride (AMARYL) 4 MG tablet Take 4 mg by mouth daily with breakfast.   Yes Historical Provider, MD  IRON PO Take by mouth.   Yes Historical Provider, MD  metFORMIN (GLUCOPHAGE) 500 MG tablet Take by mouth 2 (two) times daily with a meal.   Yes Historical Provider, MD  OXYGEN Inhale 2 L/m2 into the lungs.   Yes Historical Provider, MD  promethazine (PHENERGAN) 25 MG tablet Take 25 mg by mouth every 6 (six) hours as needed for nausea or vomiting.   Yes Historical Provider, MD  simvastatin (ZOCOR) 20 MG tablet Take 20 mg by mouth daily.   Yes Historical Provider, MD  topiramate (TOPAMAX) 50 MG tablet Take 50 mg by mouth 2 (two) times daily.   Yes Historical Provider, MD  valsartan-hydrochlorothiazide (DIOVAN-HCT) 320-25 MG tablet Take 1 tablet by mouth daily.   Yes Historical Provider, MD    Physical Exam: Filed Vitals:   09/14/15 0002 09/14/15 0018 09/14/15 0059 09/14/15 0309  BP: 134/93 125/65 171/92 180/83  Pulse:  78 76 78 78  Temp:    97.8 F (36.6 C)  TempSrc:    Oral  Resp:  18  20  Height:      Weight:    241 lb 3.2 oz (109.408 kg)  SpO2: 100% 99% 95% 100%      Constitutional: Not in distress. Filed Vitals:   09/14/15 0002 09/14/15 0018 09/14/15 0059 09/14/15 0309  BP: 134/93 125/65 171/92 180/83  Pulse: 78 76 78 78  Temp:    97.8 F (36.6 C)  TempSrc:    Oral  Resp:  18  20  Height:      Weight:    241 lb 3.2 oz (109.408 kg)  SpO2: 100% 99% 95% 100%   Eyes: Anicteric. No pallor. ENMT: No discharge from the ears eyes nose and mouth. Neck: No mass felt. No  neck rigidity. Respiratory: No rhonchi or crepitations. Cardiovascular: S1 and S2 heard. Abdomen: Soft nontender bowel sounds present. No guarding or rigidity. Musculoskeletal: No edema. Skin: No rash. Neurologic: Alert awake oriented to time place and person. Moves all extremities. Psychiatric: Appears normal.   Labs on Admission: I have personally reviewed following labs and imaging studies  CBC:  Recent Labs Lab 09/14/15 0010 09/14/15 0327  WBC 7.3 6.7  NEUTROABS 5.4  --   HGB 11.6* 11.0*  HCT 36.7 35.7*  MCV 88.9 88.6  PLT 185 [id number]   Basic Metabolic Panel:  Recent Labs Lab 09/14/15 0010 09/14/15 0327  NA 140 142  K 3.8 3.5  CL 103 105  CO2 30 29  GLUCOSE 138* 126*  BUN 27* 22*  CREATININE 1.35* 1.24*  CALCIUM 9.0 8.9   GFR: Estimated Creatinine Clearance: 49.1 mL/min (by C-G formula based on Cr of 1.24). Liver Function Tests:  Recent Labs Lab 09/14/15 0010  AST 16  ALT 11*  ALKPHOS 90  BILITOT 0.3  PROT 7.3  ALBUMIN 3.6   No results for input(s): LIPASE, AMYLASE in the last 168 hours. No results for input(s): AMMONIA in the last 168 hours. Coagulation Profile: No results for input(s): INR, PROTIME in the last 168 hours. Cardiac Enzymes: No results for input(s): CKTOTAL, CKMB, CKMBINDEX, TROPONINI in the last 168 hours. BNP (last 3 results) No results for input(s): PROBNP in the last 8760 hours. HbA1C: No results for input(s): HGBA1C in the last 72 hours. CBG: No results for input(s): GLUCAP in the last 168 hours. Lipid Profile: No results for input(s): CHOL, HDL, LDLCALC, TRIG, CHOLHDL, LDLDIRECT in the last 72 hours. Thyroid Function Tests: No results for input(s): TSH, T4TOTAL, FREET4, T3FREE, THYROIDAB in the last 72 hours. Anemia Panel: No results for input(s): VITAMINB12, FOLATE, FERRITIN, TIBC, IRON, RETICCTPCT in the last 72 hours. Urine analysis:    Component Value Date/Time   COLORURINE YELLOW 09/13/2015 [place]  09/13/2015 2315   LABSPEC 1.011 09/13/2015 2315   PHURINE 8.0 09/13/2015 2315   GLUCOSEU NEGATIVE 09/13/2015 2315   HGBUR LARGE* 09/13/2015 2315   BILIRUBINUR NEGATIVE 09/13/2015 2315   KETONESUR NEGATIVE 09/13/2015 2315   PROTEINUR NEGATIVE 09/13/2015 2315   NITRITE NEGATIVE 09/13/2015 2315   LEUKOCYTESUR MODERATE* 09/13/2015 2315   Sepsis Labs: @LABRCNTIP (procalcitonin:4,lacticidven:4) )No results found for this or any previous visit (from the past 240 hour(s)).   Radiological Exams on Admission: Dg Abd Acute W/chest  09/14/2015  CLINICAL DATA:  Moderate rectal bleeding EXAM: DG ABDOMEN ACUTE W/ 1V CHEST COMPARISON:  Jul 11, 2015 chest x-ray FINDINGS: Stable cardiomegaly. The hila and mediastinum  are unchanged. No pneumothorax. No pulmonary nodules, masses, or focal infiltrates. No free air, portal venous gas, or pneumatosis. No evidence of bowel obstruction. No interval change in the bones. IMPRESSION: No acute abnormalities. Electronically Signed   By: Dorise Bullion III M.D   On: 09/14/2015 00:12     Assessment/Plan Principal Problem:   Acute GI bleeding Active Problems:   Normocytic normochromic anemia   Essential hypertension   Diabetes mellitus type 2 in obese (HCC)   History of breast cancer   COPD (chronic obstructive pulmonary disease) ([place])    1. Acute GI bleeding - most likely lower GI bleed given that patient had frank bleeding. Patient is hemodynamically stable and hemoglobin is also at the baseline. Hold aspirin. Closely follow CBC. Patient has been kept nothing by mouth in anticipation of procedure. Consult gastroenterologist in a.m. 2. Hypertension - since patient is nothing by mouth I have placed patient on when necessary IV hydralazine. 3. Diabetes mellitus type 2 - hold oral hypoglycemics and place patient on sliding scale coverage. 4. Chronic anemia - hemoglobin appears to be at baseline. Patient is also on iron supplements. 5. Chronic kidney disease stage  III - creatinine appears to be at baseline. Closely follow metabolic panel. 6. Congestive heart failure - EF unknown. Patient appears to be compensated holding off Lasix for now due to acute GI bleed. 7. COPD/asthma - presently not wheezing continue inhalers. 8. History of breast cancer in remission.   DVT prophylaxis: SCDs. Code Status: Full code.  Family Communication: Patient's daughter.  Disposition Plan: Home.  Consults called: None.  Admission status: Observation. Telemetry.    Rise Patience MD [facility] Pager [phone].  If 7PM-7AM, please contact night-coverage [email or web address] Password [facility]  09/14/2015, 4:27 AM

## 2015-09-14 NOTE — Plan of Care (Signed)
75 year old female with DM, HTN presents with painless rectal bleeding. Hemoglobin around 11. No further bleed in ER. As per EDP patient hemodynamically stable. Patient takes aspirin. Admitted for rectal bleeding.  Kaitlyn Parrish.

## 2015-09-14 NOTE — ED Notes (Signed)
Pt. Reports she has had bleeding from her rectum today and reports she doesn't usually have this.  Per EDP the Pt. Has external Hemrroids.

## 2015-09-14 NOTE — Progress Notes (Signed)
PROGRESS NOTE    Kaitlyn Parrish  [id number] DOB: 06/09/1940 DOA: 09/13/2015 PCP: No primary care provider on file.   Brief Narrative:  Mrs Eastep is a pleasant 75 year old female with a history of stage III chronic kidney disease, hypertension, presented as a transfer from [place], where she originally presented with complaints of painless hematochezia. She denied having previous history of GI bleed. Initial lab work showing hemoglobin of 11.1 with hematocrit of 37.1. She denied taking anticoagulants or antiplatelet therapy. Case discussed with Dr. Cristina Gong of GI who recommended overnight monitoring. If she did not bleed further and hemoglobin remained stable he recommended outpatient colonoscopy.    Assessment & Plan:   Principal Problem:   Acute GI bleeding Active Problems:   Normocytic normochromic anemia   Essential hypertension   Diabetes mellitus type 2 in obese (HCC)   History of breast cancer   COPD (chronic obstructive pulmonary disease) ([place])  1.  Painless hematochezia -I suspect symptoms could be related to hemorrhoidal bleed. -She presents hemodynamically stable, mentating well, without further episodes of bright red blood per rectum. -Lab work showing stable hemoglobin of 11.1. -Case was discussed with Dr. Cristina Gong of GI who recommended monitoring overnight. If bleeding stops and hemoglobin remained stable he recommended outpatient colonoscopy  2.  History of hypertension. -Overnight having elevated blood pressures, with systolic blood pressures in the 180s. Antihypertensive agents held on admission. She had been on Diovan/HCT 320/25 -Will administer Avapro 150 mg by mouth daily  3.  History of diabetes mellitus -Overnight she was made nothing by mouth with oral hypoglycemics on hold -Advance her diet today, will restart metformin 500 mg by mouth twice a day -Continue sliding scale coverage    DVT prophylaxis: SCDs Code Status: Full code Family  Communication:  Disposition Plan:   Consultants:   Telephone consultation made to GI   Subjective: Denies further episodes of GI bleeding  Objective: Filed Vitals:   09/14/15 0059 09/14/15 0309 09/14/15 0500 09/14/15 1038  BP: 171/92 180/83 134/64   Pulse: 78 78 63 64  Temp:  97.8 F (36.6 C) 98.5 F (36.9 C)   TempSrc:  Oral Oral   Resp:  20 18 18   Height:      Weight:  109.408 kg (241 lb 3.2 oz)    SpO2: 95% 100% 98% 98%    Intake/Output Summary (Last 24 hours) at 09/14/15 1508 Last data filed at 09/14/15 0945  Gross per 24 hour  Intake    150 ml  Output    150 ml  Net      0 ml   Filed Weights   09/13/15 2252 09/14/15 0309  Weight: 109.77 kg (242 lb) 109.408 kg (241 lb 3.2 oz)    Examination:  General exam: Appears calm and comfortable  Respiratory system: Clear to auscultation. Respiratory effort normal. Cardiovascular system: S1 & S2 heard, RRR. No JVD, murmurs, rubs, gallops or clicks. No pedal edema. Gastrointestinal system: Abdomen is nondistended, soft and nontender. No organomegaly or masses felt. Normal bowel sounds heard. Central nervous system: Alert and oriented. No focal neurological deficits. Extremities: Symmetric 5 x 5 power. Skin: No rashes, lesions or ulcers Psychiatry: Judgement and insight appear normal. Mood & affect appropriate.     Data Reviewed: I have personally reviewed following labs and imaging studies  CBC:  Recent Labs Lab 09/14/15 0010 09/14/15 0327 09/14/15 0853 09/14/15 1213  WBC 7.3 6.7 6.2 5.7  NEUTROABS 5.4  --   --   --  HGB 11.6* 11.0* 11.1* 11.3*  HCT 36.7 35.7* 37.1 36.4  MCV 88.9 88.6 89.6 89.0  PLT 185 184 176 [id number]   Basic Metabolic Panel:  Recent Labs Lab 09/14/15 0010 09/14/15 0327  NA 140 142  K 3.8 3.5  CL 103 105  CO2 30 29  GLUCOSE 138* 126*  BUN 27* 22*  CREATININE 1.35* 1.24*  CALCIUM 9.0 8.9   GFR: Estimated Creatinine Clearance: 49.1 mL/min (by C-G formula based on Cr of  1.24). Liver Function Tests:  Recent Labs Lab 09/14/15 0010  AST 16  ALT 11*  ALKPHOS 90  BILITOT 0.3  PROT 7.3  ALBUMIN 3.6   No results for input(s): LIPASE, AMYLASE in the last 168 hours. No results for input(s): AMMONIA in the last 168 hours. Coagulation Profile: No results for input(s): INR, PROTIME in the last 168 hours. Cardiac Enzymes: No results for input(s): CKTOTAL, CKMB, CKMBINDEX, TROPONINI in the last 168 hours. BNP (last 3 results) No results for input(s): PROBNP in the last 8760 hours. HbA1C: No results for input(s): HGBA1C in the last 72 hours. CBG:  Recent Labs Lab 09/14/15 0509 09/14/15 0938 09/14/15 1138  GLUCAP 117* 98 89   Lipid Profile: No results for input(s): CHOL, HDL, LDLCALC, TRIG, CHOLHDL, LDLDIRECT in the last 72 hours. Thyroid Function Tests: No results for input(s): TSH, T4TOTAL, FREET4, T3FREE, THYROIDAB in the last 72 hours. Anemia Panel: No results for input(s): VITAMINB12, FOLATE, FERRITIN, TIBC, IRON, RETICCTPCT in the last 72 hours. Sepsis Labs: No results for input(s): PROCALCITON, LATICACIDVEN in the last 168 hours.  No results found for this or any previous visit (from the past 240 hour(s)).       Radiology Studies: Dg Abd Acute W/chest  09/14/2015  CLINICAL DATA:  Moderate rectal bleeding EXAM: DG ABDOMEN ACUTE W/ 1V CHEST COMPARISON:  Jul 11, 2015 chest x-ray FINDINGS: Stable cardiomegaly. The hila and mediastinum are unchanged. No pneumothorax. No pulmonary nodules, masses, or focal infiltrates. No free air, portal venous gas, or pneumatosis. No evidence of bowel obstruction. No interval change in the bones. IMPRESSION: No acute abnormalities. Electronically Signed   By: Dorise Bullion III M.D   On: 09/14/2015 00:12        Scheduled Meds: . budesonide-formoterol  2 puff Inhalation BID  . famotidine (PEPCID) IV  20 mg Intravenous Q12H  . insulin aspart  0-9 Units Subcutaneous Q4H   Continuous Infusions: . sodium  chloride 10 mL/hr at 09/14/15 0601        Time spent:     Kelvin Cellar, MD [facility] Pager [phone]  If 7PM-7AM, please contact night-coverage [email or web address] Password [email or web address] 09/14/2015, 3:08 PM

## 2015-09-14 NOTE — Plan of Care (Signed)
Problem: Safety: Goal: Ability to remain free from injury will improve Outcome: Progressing Bed alarm and pt. not getting up alone explained to her.  Problem: Pain Managment: Goal: General experience of comfort will improve Outcome: Progressing No c/o's pain at present.

## 2015-09-15 DIAGNOSIS — K921 Melena: Secondary | ICD-10-CM | POA: Diagnosis not present

## 2015-09-15 DIAGNOSIS — I10 Essential (primary) hypertension: Secondary | ICD-10-CM | POA: Diagnosis not present

## 2015-09-15 DIAGNOSIS — K922 Gastrointestinal hemorrhage, unspecified: Secondary | ICD-10-CM | POA: Diagnosis not present

## 2015-09-15 LAB — GLUCOSE, CAPILLARY
GLUCOSE-CAPILLARY: 129 mg/dL — AB (ref 65–99)
GLUCOSE-CAPILLARY: 194 mg/dL — AB (ref 65–99)
Glucose-Capillary: 124 mg/dL — ABNORMAL HIGH (ref 65–99)
Glucose-Capillary: 155 mg/dL — ABNORMAL HIGH (ref 65–99)

## 2015-09-15 LAB — BASIC METABOLIC PANEL
Anion gap: 4 — ABNORMAL LOW (ref 5–15)
BUN: 19 mg/dL (ref 6–20)
CALCIUM: 8.9 mg/dL (ref 8.9–10.3)
CO2: 31 mmol/L (ref 22–32)
CREATININE: 1.24 mg/dL — AB (ref 0.44–1.00)
Chloride: 107 mmol/L (ref 101–111)
GFR calc non Af Amer: 41 mL/min — ABNORMAL LOW (ref >60)
GFR, EST AFRICAN AMERICAN: 48 mL/min — AB (ref >60)
Glucose, Bld: 119 mg/dL — ABNORMAL HIGH (ref 65–99)
Potassium: 3.9 mmol/L (ref 3.5–5.1)
Sodium: 142 mmol/L (ref 135–145)

## 2015-09-15 LAB — CBC
HEMATOCRIT: 35.9 % — AB (ref 36.0–46.0)
Hemoglobin: 11.1 g/dL — ABNORMAL LOW (ref 12.0–15.0)
MCH: 27.5 pg (ref 26.0–34.0)
MCHC: 30.9 g/dL (ref 30.0–36.0)
MCV: 88.9 fL (ref 78.0–100.0)
Platelets: 186 10*3/uL (ref 150–400)
RBC: 4.04 MIL/uL (ref 3.87–5.11)
RDW: 13.9 % (ref 11.5–15.5)
WBC: 5.6 10*3/uL (ref 4.0–10.5)

## 2015-09-15 MED ORDER — IPRATROPIUM-ALBUTEROL 0.5-2.5 (3) MG/3ML IN SOLN: 3.0000 mL | Freq: Four times a day (QID) | RESPIRATORY_TRACT | Status: DC | PRN

## 2015-09-15 NOTE — Discharge Summary (Signed)
Physician Discharge Summary  Kaitlyn Parrish [id number] DOB: 04-24-1940 DOA: 09/13/2015  PCP: Guadlupe Spanish, MD  Admit date: 09/13/2015 Discharge date: 09/15/2015  Time spent: 35 minutes  Recommendations for Outpatient Follow-up:  1. Recommended she follow up with her GI physician for outpatient colonoscopy as recommended by Dr Cristina Gong of GI from telephone consult.  2. She presented with acute hematochezia suspect secondary to hemorrhoidal bleed. Had Hg of 11.1 on 09/15/2015   Discharge Diagnoses:  Principal Problem:   Acute GI bleeding Active Problems:   Normocytic normochromic anemia   Essential hypertension   Diabetes mellitus type 2 in obese [facility])   History of breast cancer   COPD (chronic obstructive pulmonary disease) ([place])   Discharge Condition: Stable  Diet recommendation: Carb Mod Diet  Filed Weights   09/13/15 2252 09/14/15 0309 09/15/15 0430  Weight: 109.77 kg (242 lb) 109.408 kg (241 lb 3.2 oz) 109.181 kg (240 lb 11.2 oz)    History of present illness:  Kaitlyn Parrish is a 75 y.o. female with diabetes mellitus type 2, chronic kidney disease stage III, chronic anemia, hypertension, CHF, breast cancer in remission presented to the ER at [facility] with complaints of having rectal bleeding. Patient started developing painless rectal bleeding since yesterday and had at least 4 episodes as per the patient's daughter. Patient had frank rectal bleeding. Denies any abdominal pain nausea vomiting. Patient takes aspirin. Denies taking any NSAIDs. Patient's hemoglobin is around 11 which is her baseline when compared to the old one in care everywhere. Patient states she has never had any GI bleed previously. Has had colonoscopy at least 10 years ago which as per the patient was normal. Patient is presently hemodynamically stable and has been admitted for further management of rectal bleed.   Hospital Course:  Kaitlyn Parrish is a pleasant 75 year old female with a history of  stage III chronic kidney disease, hypertension, presented as a transfer from [place], where she originally presented with complaints of painless hematochezia. She denied having previous history of GI bleed. Initial lab work showing hemoglobin of 11.1 with hematocrit of 37.1. She denied taking anticoagulants or antiplatelet therapy. Case discussed with Dr. Cristina Gong of GI who recommended overnight monitoring. If she did not bleed further and hemoglobin remained stable he recommended outpatient colonoscopy. Her HG remained stable during this hospitalization as hematochezia resolved. She was discharged home in stable condition on 09/15/2015.   1. Painless hematochezia -I suspect symptoms could be related to hemorrhoidal bleed. -She presents hemodynamically stable, mentating well, without further episodes of bright red blood per rectum. -Lab work showing stable hemoglobin of 11.1. -Case was discussed with Dr. Cristina Gong of GI who recommended monitoring overnight. If bleeding stops and hemoglobin remained stable he recommended outpatient colonoscopy  2. History of hypertension. -She was discharged on her home regimen with Norvasc 2.5 mg PO q daily and Diovan HCT 320/25 1 tab po q daily  3. History of diabetes mellitus -Overnight she was made nothing by mouth with oral hypoglycemics on hold -Advance her diet today, will restart metformin 500 mg by mouth twice a day -Continue sliding scale coverage   Procedures:    Consultations:  Telephone consultation made to Dr Cristina Gong of GI  Discharge Exam: Filed Vitals:   09/15/15 0430 09/15/15 0858  BP: 143/71   Pulse: 77 87  Temp: 98.1 F (36.7 C)   Resp: 17 18    General exam: Appears calm and comfortable  Respiratory system: Clear to auscultation. Respiratory effort normal.  Cardiovascular system: S1 & S2 heard, RRR. No JVD, murmurs, rubs, gallops or clicks. No pedal edema. Gastrointestinal system: Abdomen is nondistended, soft and  nontender. No organomegaly or masses felt. Normal bowel sounds heard. Central nervous system: Alert and oriented. No focal neurological deficits. Extremities: Symmetric 5 x 5 power. Skin: No rashes, lesions or ulcers Psychiatry: Judgement and insight appear normal. Mood & affect appropriate.   Discharge Instructions   Discharge Instructions    Call MD for:  difficulty breathing, headache or visual disturbances    Complete by:  As directed      Call MD for:  extreme fatigue    Complete by:  As directed      Call MD for:  hives    Complete by:  As directed      Call MD for:  persistant dizziness or light-headedness    Complete by:  As directed      Call MD for:  persistant nausea and vomiting    Complete by:  As directed      Call MD for:  redness, tenderness, or signs of infection (pain, swelling, redness, odor or green/yellow discharge around incision site)    Complete by:  As directed      Call MD for:  severe uncontrolled pain    Complete by:  As directed      Call MD for:  temperature >100.4    Complete by:  As directed      Call MD for:    Complete by:  As directed      Diet - low sodium heart healthy    Complete by:  As directed      Increase activity slowly    Complete by:  As directed           Current Discharge Medication List    CONTINUE these medications which have NOT CHANGED   Details  albuterol (PROVENTIL HFA;VENTOLIN HFA) 108 (90 Base) MCG/ACT inhaler Inhale 2 puffs into the lungs every 6 (six) hours as needed for wheezing or shortness of breath.    amLODipine (NORVASC) 2.5 MG tablet Take 2.5 mg by mouth daily.    budesonide-formoterol (SYMBICORT) 160-4.5 MCG/ACT inhaler Inhale 2 puffs into the lungs 2 (two) times daily.    furosemide (LASIX) 80 MG tablet Take 80 mg by mouth daily.     glimepiride (AMARYL) 4 MG tablet Take 4 mg by mouth daily with breakfast.    IRON PO Take 1 tablet by mouth daily.     metFORMIN (GLUCOPHAGE) 500 MG tablet Take 500 mg  by mouth 2 (two) times daily with a meal.     promethazine (PHENERGAN) 25 MG tablet Take 25 mg by mouth every 6 (six) hours as needed for nausea or vomiting.    simvastatin (ZOCOR) 20 MG tablet Take 20 mg by mouth daily.    topiramate (TOPAMAX) 50 MG tablet Take 50 mg by mouth 2 (two) times daily.    valsartan-hydrochlorothiazide (DIOVAN-HCT) 320-25 MG tablet Take 1 tablet by mouth daily.      STOP taking these medications     aspirin 81 MG tablet      OXYGEN        Allergies  Allergen Reactions  . Pregabalin Itching    Probable allergen   Follow-up Information    Follow up with Guadlupe Spanish, MD In 2 weeks.   Specialty:  Internal Medicine   Contact information:   [address] [place] [address] [phone]  Follow up with Cleotis Nipper, MD In 4 weeks.   Specialty:  Gastroenterology   Contact information:   [id number] [place] [address] [phone]        The results of significant diagnostics from this hospitalization (including imaging, microbiology, ancillary and laboratory) are listed below for reference.    Significant Diagnostic Studies: Dg Abd Acute W/chest  09/14/2015  CLINICAL DATA:  Moderate rectal bleeding EXAM: DG ABDOMEN ACUTE W/ 1V CHEST COMPARISON:  Jul 11, 2015 chest x-ray FINDINGS: Stable cardiomegaly. The hila and mediastinum are unchanged. No pneumothorax. No pulmonary nodules, masses, or focal infiltrates. No free air, portal venous gas, or pneumatosis. No evidence of bowel obstruction. No interval change in the bones. IMPRESSION: No acute abnormalities. Electronically Signed   By: Dorise Bullion III M.D   On: 09/14/2015 00:12    Microbiology: No results found for this or any previous visit (from the past 240 hour(s)).   Labs: Basic Metabolic Panel:  Recent Labs Lab 09/14/15 0010 09/14/15 0327 09/15/15 0454  NA 140 142 142  K 3.8 3.5 3.9  CL 103 105 107  CO2 30 29 31   GLUCOSE 138* 126* 119*   BUN 27* 22* 19  CREATININE 1.35* 1.24* 1.24*  CALCIUM 9.0 8.9 8.9   Liver Function Tests:  Recent Labs Lab 09/14/15 0010  AST 16  ALT 11*  ALKPHOS 90  BILITOT 0.3  PROT 7.3  ALBUMIN 3.6   No results for input(s): LIPASE, AMYLASE in the last 168 hours. No results for input(s): AMMONIA in the last 168 hours. CBC:  Recent Labs Lab 09/14/15 0010 09/14/15 0327 09/14/15 0853 09/14/15 1213 09/15/15 0454  WBC 7.3 6.7 6.2 5.7 5.6  NEUTROABS 5.4  --   --   --   --   HGB 11.6* 11.0* 11.1* 11.3* 11.1*  HCT 36.7 35.7* 37.1 36.4 35.9*  MCV 88.9 88.6 89.6 89.0 88.9  PLT 185 184 176 189 186   Cardiac Enzymes: No results for input(s): CKTOTAL, CKMB, CKMBINDEX, TROPONINI in the last 168 hours. BNP: BNP (last 3 results) No results for input(s): BNP in the last 8760 hours.  ProBNP (last 3 results) No results for input(s): PROBNP in the last 8760 hours.  CBG:  Recent Labs Lab 09/14/15 2048 09/15/15 0028 09/15/15 0440 09/15/15 0826 09/15/15 1126  GLUCAP 123* 155* 124* 129* 194*       Signed:  Kelvin Cellar MD.  [facility] 09/15/2015, 12:51 PM

## 2015-11-04 ENCOUNTER — Emergency Department (HOSPITAL_BASED_OUTPATIENT_CLINIC_OR_DEPARTMENT_OTHER): Payer: Medicare HMO

## 2015-11-04 ENCOUNTER — Emergency Department (HOSPITAL_BASED_OUTPATIENT_CLINIC_OR_DEPARTMENT_OTHER)
Admission: EM | Admit: 2015-11-04 | Discharge: 2015-11-04 | Disposition: A | Discharge status: Home / Self Care | Payer: Medicare HMO | Attending: Emergency Medicine | Admitting: Emergency Medicine

## 2015-11-04 ENCOUNTER — Encounter (HOSPITAL_BASED_OUTPATIENT_CLINIC_OR_DEPARTMENT_OTHER): Payer: Self-pay | Admitting: Respiratory Therapy

## 2015-11-04 DIAGNOSIS — R05 Cough: Secondary | ICD-10-CM | POA: Diagnosis present

## 2015-11-04 DIAGNOSIS — I509 Heart failure, unspecified: Secondary | ICD-10-CM | POA: Diagnosis not present

## 2015-11-04 DIAGNOSIS — J441 Chronic obstructive pulmonary disease with (acute) exacerbation: Secondary | ICD-10-CM | POA: Insufficient documentation

## 2015-11-04 DIAGNOSIS — Z79899 Other long term (current) drug therapy: Secondary | ICD-10-CM | POA: Diagnosis not present

## 2015-11-04 DIAGNOSIS — Z7984 Long term (current) use of oral hypoglycemic drugs: Secondary | ICD-10-CM | POA: Diagnosis not present

## 2015-11-04 DIAGNOSIS — Z853 Personal history of malignant neoplasm of breast: Secondary | ICD-10-CM | POA: Diagnosis not present

## 2015-11-04 DIAGNOSIS — J45909 Unspecified asthma, uncomplicated: Secondary | ICD-10-CM | POA: Insufficient documentation

## 2015-11-04 DIAGNOSIS — R059 Cough, unspecified: Secondary | ICD-10-CM

## 2015-11-04 DIAGNOSIS — I110 Hypertensive heart disease with heart failure: Secondary | ICD-10-CM | POA: Insufficient documentation

## 2015-11-04 DIAGNOSIS — E119 Type 2 diabetes mellitus without complications: Secondary | ICD-10-CM | POA: Diagnosis not present

## 2015-11-04 LAB — BRAIN NATRIURETIC PEPTIDE: B NATRIURETIC PEPTIDE 5: 16 pg/mL (ref 0.0–100.0)

## 2015-11-04 LAB — BASIC METABOLIC PANEL
Anion gap: 6 (ref 5–15)
BUN: 25 mg/dL — AB (ref 6–20)
CALCIUM: 9.1 mg/dL (ref 8.9–10.3)
CO2: 32 mmol/L (ref 22–32)
Chloride: 104 mmol/L (ref 101–111)
Creatinine, Ser: 1.18 mg/dL — ABNORMAL HIGH (ref 0.44–1.00)
GFR, EST AFRICAN AMERICAN: 51 mL/min — AB (ref >60)
GFR, EST NON AFRICAN AMERICAN: 44 mL/min — AB (ref >60)
GLUCOSE: 129 mg/dL — AB (ref 65–99)
POTASSIUM: 3.9 mmol/L (ref 3.5–5.1)
SODIUM: 142 mmol/L (ref 135–145)

## 2015-11-04 LAB — CBC WITH DIFFERENTIAL/PLATELET
BASOS ABS: 0 10*3/uL (ref 0.0–0.1)
BASOS PCT: 0 %
EOS PCT: 2 %
Eosinophils Absolute: 0.1 10*3/uL (ref 0.0–0.7)
HCT: 36 % (ref 36.0–46.0)
Hemoglobin: 11.1 g/dL — ABNORMAL LOW (ref 12.0–15.0)
Lymphocytes Relative: 14 %
Lymphs Abs: 0.8 10*3/uL (ref 0.7–4.0)
MCH: 27.8 pg (ref 26.0–34.0)
MCHC: 30.8 g/dL (ref 30.0–36.0)
MCV: 90 fL (ref 78.0–100.0)
MONO ABS: 0.6 10*3/uL (ref 0.1–1.0)
Monocytes Relative: 10 %
NEUTROS ABS: 4.4 10*3/uL (ref 1.7–7.7)
Neutrophils Relative %: 74 %
PLATELETS: 155 10*3/uL (ref 150–400)
RBC: 4 MIL/uL (ref 3.87–5.11)
RDW: 13.9 % (ref 11.5–15.5)
WBC: 6 10*3/uL (ref 4.0–10.5)

## 2015-11-04 MED ORDER — IOPAMIDOL (ISOVUE-300) INJECTION 61%
80.0000 mL | Freq: Once | INTRAVENOUS | Status: AC | PRN
  Administered 2015-11-04: 80 mL via INTRAVENOUS

## 2015-11-04 MED ORDER — PREDNISONE 10 MG PO TABS: 20.0000 mg | ORAL_TABLET | Freq: Two times a day (BID) | ORAL | 0 refills | Status: AC

## 2015-11-04 MED ORDER — METHYLPREDNISOLONE SODIUM SUCC 125 MG IJ SOLR
125.0000 mg | Freq: Once | INTRAMUSCULAR | Status: AC
Start: 2015-11-04 — End: 2015-11-04
  Administered 2015-11-04: 125 mg via INTRAVENOUS
  Filled 2015-11-04: qty 2

## 2015-11-04 MED ORDER — AZITHROMYCIN 250 MG PO TABS: 250.0000 mg | ORAL_TABLET | Freq: Every day | ORAL | 0 refills | Status: AC

## 2015-11-04 MED ORDER — IPRATROPIUM-ALBUTEROL 0.5-2.5 (3) MG/3ML IN SOLN
3.0000 mL | Freq: Four times a day (QID) | RESPIRATORY_TRACT | Status: DC
  Administered 2015-11-04: 3 mL via RESPIRATORY_TRACT
  Filled 2015-11-04: qty 3

## 2015-11-04 MED FILL — AZITHROMYCIN 250 MG TABLET: 250 | 5 days supply | Qty: 6 | Fill #0

## 2015-11-04 MED FILL — predniSONE 10 MG TABS: 10 | 5 days supply | Qty: 20 | Fill #0

## 2015-11-04 NOTE — ED Triage Notes (Signed)
Cough x 3 weeks. She completed Levofloxacin but feels worse.

## 2015-11-04 NOTE — ED Notes (Signed)
Patient transported to X-ray 

## 2015-11-04 NOTE — Discharge Instructions (Signed)
Zithromax and prednisone as prescribed.  You're to follow-up with your primary Dr. in the next week to discuss a bone scan to further evaluate the abnormalities found on your chest x-ray and chest CT.  Return to the ER if your symptoms significantly worsen or change.

## 2015-11-04 NOTE — ED Notes (Signed)
Low sats noted at triage. Pt states she is suppose to be on home oxygen at 2l/m [place]

## 2015-11-04 NOTE — ED Provider Notes (Signed)
[place] DEPT [facility] Provider Note   CSN: [id number] Arrival date & time: 11/04/15  1225     History   Chief Complaint Chief Complaint  Patient presents with  . Cough    HPI Kaitlyn Parrish is a 75 y.o. female.  Patient is a 75 year old female with history of COPD, CHF, diabetes. She presents today for evaluation of chest congestion and cough which is worsened over the past 3 weeks. She was treated with Levaquin by her primary doctor, however this has not helped. She denies any fevers or chills. She denies any chest pain. Does report swelling in her legs, however this is unusual.   The history is provided by the patient.  Cough  This is a new problem. Episode onset: 3 weeks ago. The problem occurs constantly. The problem has been gradually worsening. The cough is productive of sputum. There has been no fever. Associated symptoms include shortness of breath. Pertinent negatives include no chest pain and no chills. Treatments tried: Levaquin. The treatment provided no relief. She is not a smoker.    Past Medical History:  Diagnosis Date  . Acute lower GI bleeding   . Arthritis    "mostly all over" (09/14/2015)  . Asthma   . Cancer of left breast ([place])   . CHF (congestive heart failure) ([place])   . COPD (chronic obstructive pulmonary disease) ([place])   . High cholesterol   . Hypertension   . Neuropathy ([place])   . On home oxygen therapy    "2L at night; during the day when I need it" (09/14/2015)  . Stroke [facility])    "little weaker on my left side since" (09/14/2015)  . Type II diabetes mellitus [facility])     Patient Active Problem List   Diagnosis Date Noted  . GI bleed 09/14/2015  . Acute GI bleeding 09/14/2015  . Normocytic normochromic anemia 09/14/2015  . Essential hypertension 09/14/2015  . Diabetes mellitus type 2 in obese ([place]) 09/14/2015  . History of breast cancer 09/14/2015  . COPD (chronic obstructive pulmonary disease) ([place]) 09/14/2015    Past Surgical History:    Procedure Laterality Date  . ABDOMINAL HYSTERECTOMY    . BREAST BIOPSY Left   . CATARACT EXTRACTION, BILATERAL Bilateral   . CESAREAN SECTION    . MASTECTOMY Left ~ 2003  . TONSILLECTOMY      OB History    No data available       Home Medications    Prior to Admission medications   Medication Sig Start Date End Date Taking? Authorizing Provider  albuterol (PROVENTIL HFA;VENTOLIN HFA) 108 (90 Base) MCG/ACT inhaler Inhale 2 puffs into the lungs every 6 (six) hours as needed for wheezing or shortness of breath.    Historical Provider, MD  amLODipine (NORVASC) 2.5 MG tablet Take 2.5 mg by mouth daily.    Historical Provider, MD  budesonide-formoterol (SYMBICORT) 160-4.5 MCG/ACT inhaler Inhale 2 puffs into the lungs 2 (two) times daily.    Historical Provider, MD  furosemide (LASIX) 80 MG tablet Take 80 mg by mouth daily.     Historical Provider, MD  glimepiride (AMARYL) 4 MG tablet Take 4 mg by mouth daily with breakfast.    Historical Provider, MD  IRON PO Take 1 tablet by mouth daily.     Historical Provider, MD  metFORMIN (GLUCOPHAGE) 500 MG tablet Take 500 mg by mouth 2 (two) times daily with a meal.     Historical Provider, MD  promethazine (PHENERGAN) 25 MG tablet Take 25  mg by mouth every 6 (six) hours as needed for nausea or vomiting.    Historical Provider, MD  simvastatin (ZOCOR) 20 MG tablet Take 20 mg by mouth daily.    Historical Provider, MD  topiramate (TOPAMAX) 50 MG tablet Take 50 mg by mouth 2 (two) times daily.    Historical Provider, MD  valsartan-hydrochlorothiazide (DIOVAN-HCT) 320-25 MG tablet Take 1 tablet by mouth daily.    Historical Provider, MD    Family History Family History  Problem Relation Age of Onset  . Throat cancer Father   . Diabetes Mellitus II Sister     Social History Social History  Substance Use Topics  . Smoking status: Never Smoker  . Smokeless tobacco: Never Used  . Alcohol use No     Allergies   Pregabalin   Review of  Systems Review of Systems  Constitutional: Negative for chills.  Respiratory: Positive for cough and shortness of breath.   Cardiovascular: Negative for chest pain.  All other systems reviewed and are negative.    Physical Exam Updated Vital Signs BP 153/78   Pulse 96   Temp 98.2 F (36.8 C) (Oral)   Resp 20   Ht 5\' 4"  (1.626 m)   Wt 240 lb (108.9 kg)   SpO2 94%   BMI 41.20 kg/m   Physical Exam  Constitutional: She is oriented to person, place, and time. She appears well-developed and well-nourished. No distress.  HENT:  Head: Normocephalic and atraumatic.  Neck: Normal range of motion. Neck supple.  Cardiovascular: Normal rate and regular rhythm.  Exam reveals no gallop and no friction rub.   No murmur heard. Pulmonary/Chest: Effort normal and breath sounds normal. No respiratory distress. She has no wheezes.  Abdominal: Soft. Bowel sounds are normal. She exhibits no distension. There is no tenderness.  Musculoskeletal: Normal range of motion. She exhibits edema.  There is 2+ pitting edema of both lower extremities.  Neurological: She is alert and oriented to person, place, and time.  Skin: Skin is warm and dry. She is not diaphoretic.  Nursing note and vitals reviewed.    ED Treatments / Results  Labs (all labs ordered are listed, but only abnormal results are displayed) Labs Reviewed  BASIC METABOLIC PANEL  CBC WITH DIFFERENTIAL/PLATELET  BRAIN NATRIURETIC PEPTIDE    EKG  EKG Interpretation  Date/Time:  Friday November 04 2015 13:08:25 EDT Ventricular Rate:  98 PR Interval:    QRS Duration: 81 QT Interval:  359 QTC Calculation: 459 R Axis:   79 Text Interpretation:  Age not entered, assumed to be  75 years old for purpose of ECG interpretation Sinus rhythm ST elev, probable normal early repol pattern Baseline wander in lead(s) I III aVR aVL aVF V1 V5 Confirmed by Kem Parcher  MD, Sarye Kath ([id number]) on 11/04/2015 1:11:44 PM Also confirmed by Stark Jock  MD, Cleon Thoma ([id number]),  editor Rolla Plate, Joelene Millin [phone])  on 11/04/2015 2:03:23 PM       Radiology No results found.  Procedures Procedures (including critical care time)  Medications Ordered in ED Medications  methylPREDNISolone sodium succinate (SOLU-MEDROL) 125 mg/2 mL injection 125 mg (not administered)  ipratropium-albuterol (DUONEB) 0.5-2.5 (3) MG/3ML nebulizer solution 3 mL (not administered)     Initial Impression / Assessment and Plan / ED Course  I have reviewed the triage vital signs and the nursing notes.  Pertinent labs & imaging results that were available during my care of the patient were reviewed by me and considered in my medical decision making (see  chart for details).  Clinical Course    Patient presents with complaints of persistent cough for the past several weeks despite antibiotics. Her chest x-ray reveals no evidence for infiltrate, however does suggest sclerotic lesions possibly suggestive of a metastatic process. A chest CT was recommended and was obtained. This revealed several foci of sclerosis through the anterior chest wall of undetermined significance. They're recommending an outpatient bone scan. She will be advised to follow-up with her primary Dr. for this. I will prescribe an antibiotic for her persistent cough as well as a steroid.  Final Clinical Impressions(s) / ED Diagnoses   Final diagnoses:  None    New Prescriptions New Prescriptions   No medications on file     Veryl Speak, MD 11/04/15 1540

## 2015-11-04 NOTE — ED Notes (Signed)
Patient transported to CT 

## 2015-12-27 ENCOUNTER — Other Ambulatory Visit: Payer: Self-pay | Admitting: Internal Medicine

## 2015-12-27 DIAGNOSIS — Z1231 Encounter for screening mammogram for malignant neoplasm of breast: Secondary | ICD-10-CM

## 2016-02-10 ENCOUNTER — Ambulatory Visit: Payer: Medicare Other

## 2016-06-20 ENCOUNTER — Ambulatory Visit: Payer: Medicare Other

## 2016-06-20 ENCOUNTER — Other Ambulatory Visit (HOSPITAL_BASED_OUTPATIENT_CLINIC_OR_DEPARTMENT_OTHER): Payer: Self-pay | Admitting: Internal Medicine

## 2016-06-20 DIAGNOSIS — Z1231 Encounter for screening mammogram for malignant neoplasm of breast: Secondary | ICD-10-CM

## 2016-06-30 ENCOUNTER — Ambulatory Visit (HOSPITAL_BASED_OUTPATIENT_CLINIC_OR_DEPARTMENT_OTHER)
Admission: RE | Admit: 2016-06-30 | Discharge: 2016-06-30 | Disposition: A | Discharge status: Home / Self Care | Payer: Medicare HMO | Source: Ambulatory Visit | Attending: Internal Medicine | Admitting: Internal Medicine

## 2016-06-30 DIAGNOSIS — Z1231 Encounter for screening mammogram for malignant neoplasm of breast: Secondary | ICD-10-CM | POA: Insufficient documentation

## 2016-06-30 DIAGNOSIS — Z9012 Acquired absence of left breast and nipple: Secondary | ICD-10-CM | POA: Insufficient documentation

## 2017-07-05 IMAGING — MG 2D DIGITAL SCREENING UNILATERAL RIGHT MAMMOGRAM WITH CAD AND ADJ
8 series · 8 of 24 positions shown · non-contrast
Comparison: Previous exam(s).

CLINICAL DATA: Screening.

EXAM:
2D DIGITAL SCREENING UNILATERAL RIGHT MAMMOGRAM WITH CAD AND ADJUNCT
TOMO

[R CC]
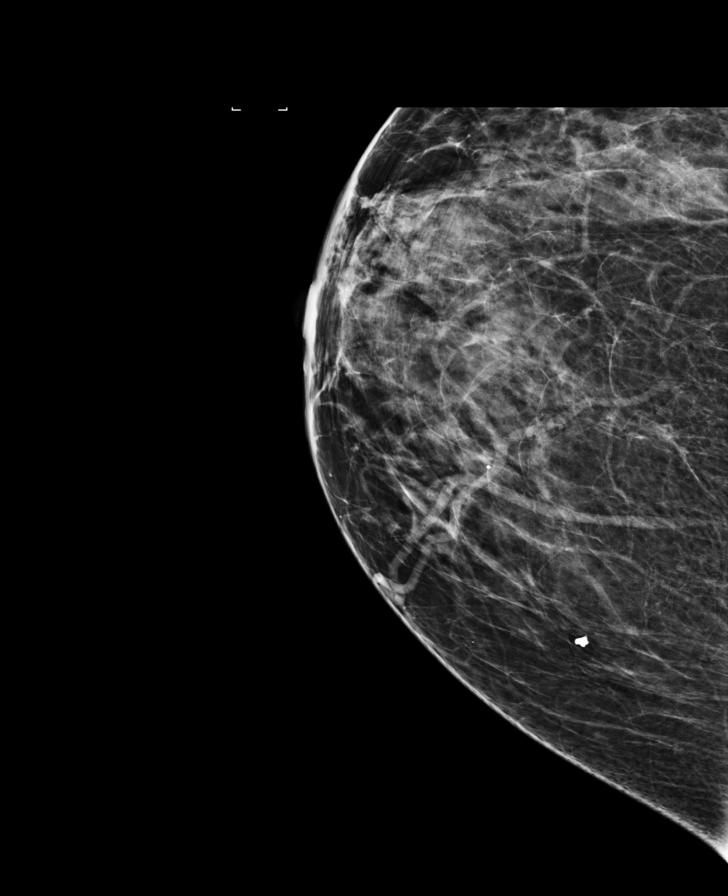

[R MLO (1 of 2)]
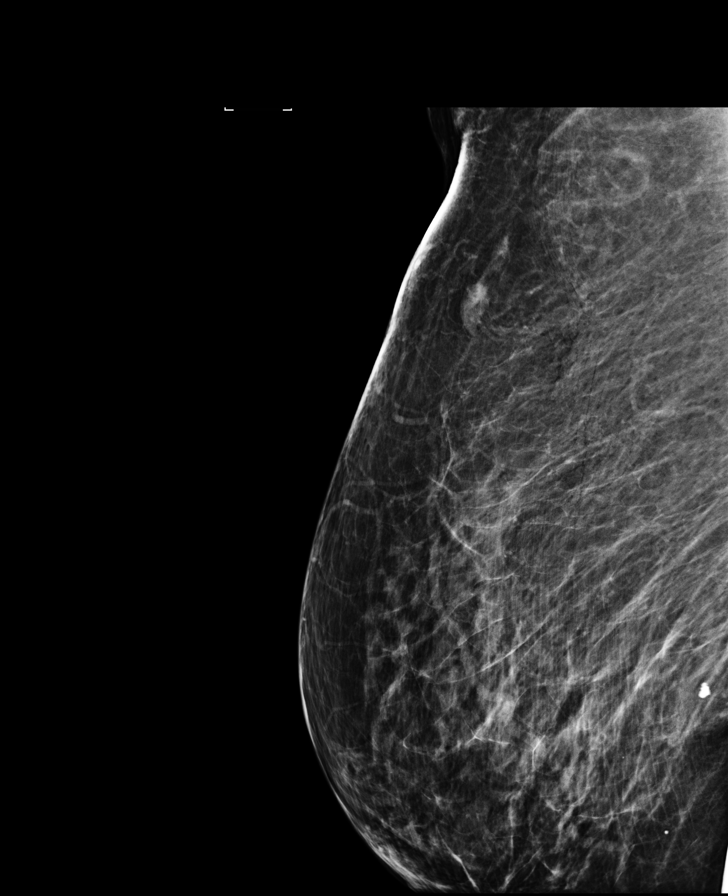

[R MLO (2 of 2)]
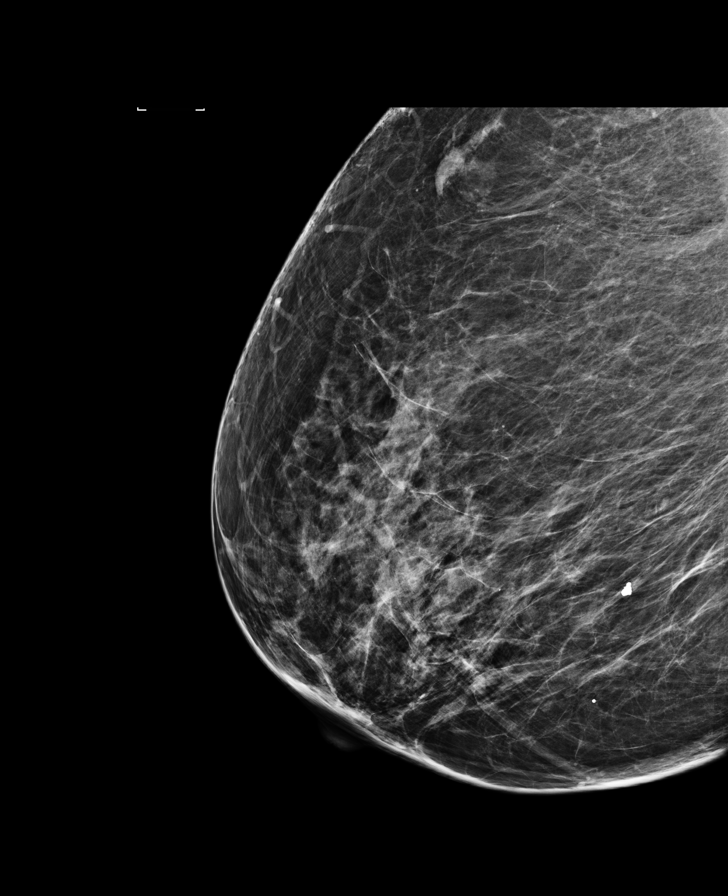

[R XCCL]
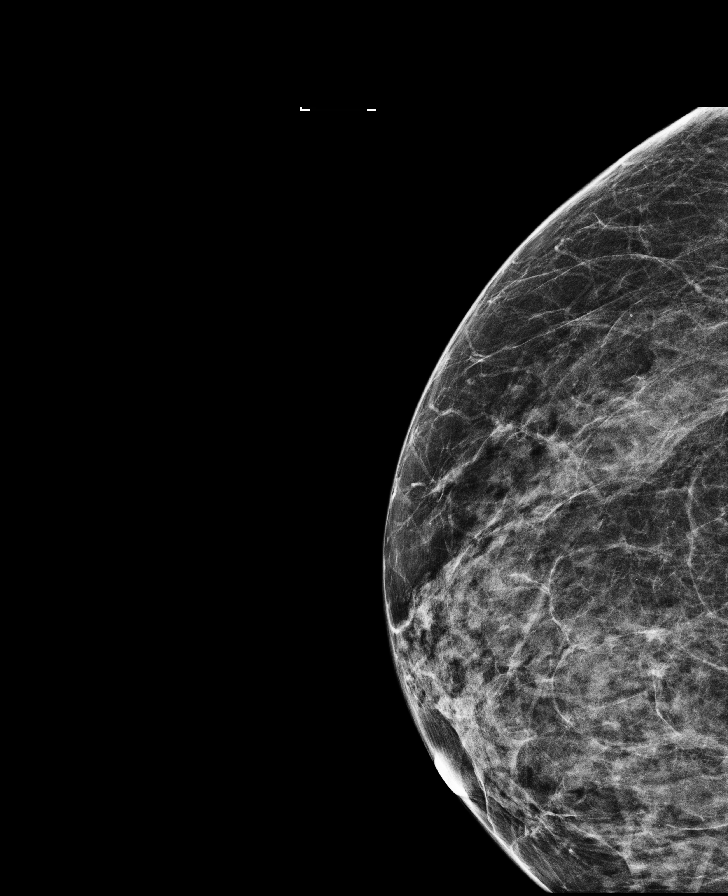

[R MLO tomo (1 of 2) · tomo slice 40/79.0]
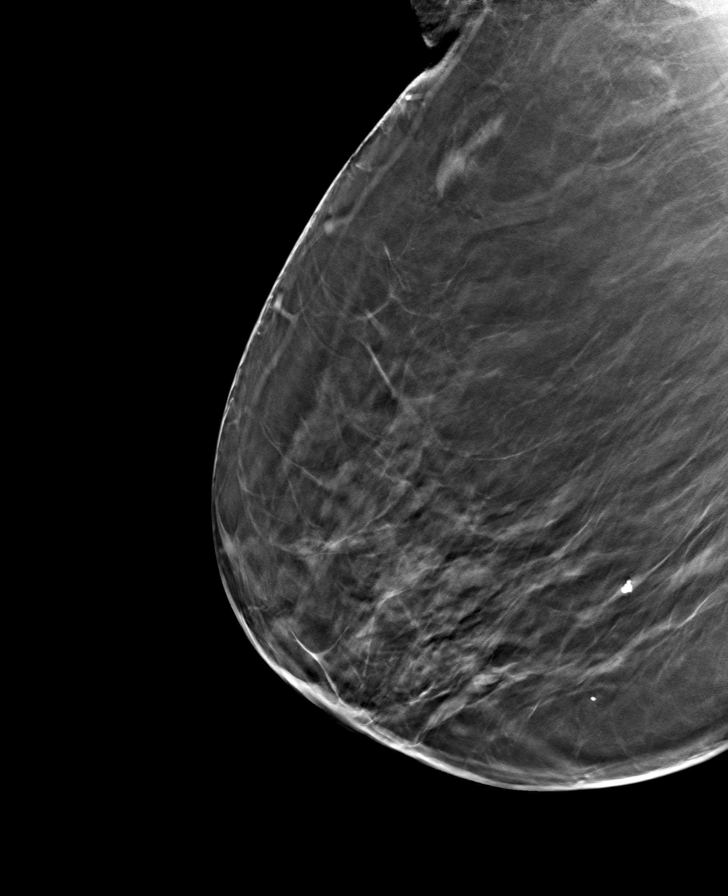

[R MLO tomo (2 of 2) · tomo slice 47/94.0]
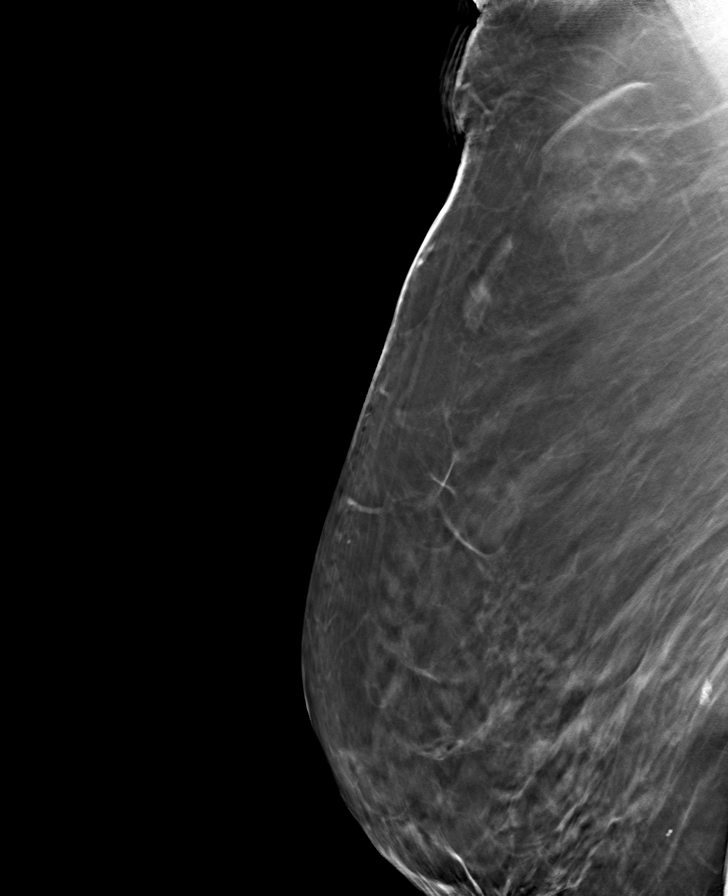

[R XCCL tomo · tomo slice 26/51.0]
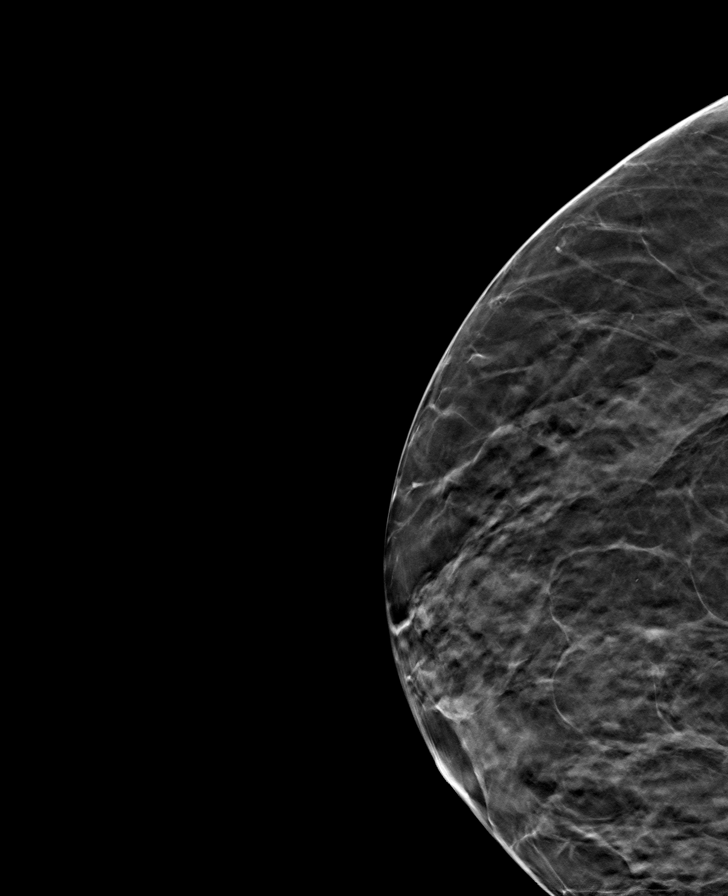

[R CC tomo · tomo slice 29/57.0]
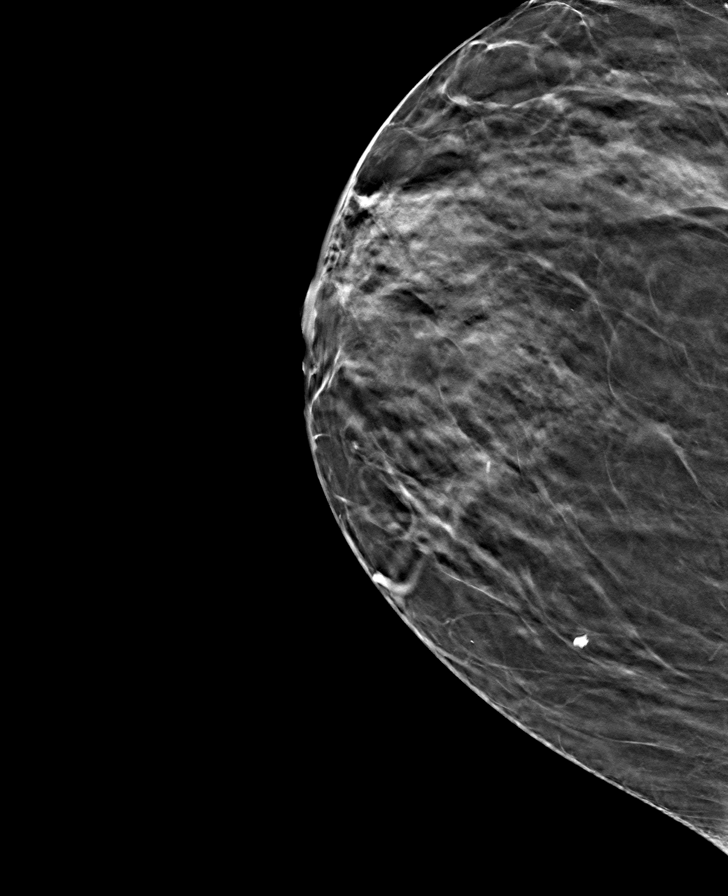

[8 of 24 positions shown; findings below may reference images not displayed]

ACR Breast Density Category c: The breast tissue is heterogeneously
dense, which may obscure small masses.
FINDINGS: The patient has had a left mastectomy. There are no findings
suspicious for malignancy. Images were processed with CAD.
IMPRESSION: No mammographic evidence of malignancy. A result letter of this
screening mammogram will be mailed directly to the patient.

RECOMMENDATION:
Screening mammogram in one year.  (Code:H7-4-4V7)

BI-RADS CATEGORY  1: Negative.

## 2017-08-19 ENCOUNTER — Other Ambulatory Visit (HOSPITAL_BASED_OUTPATIENT_CLINIC_OR_DEPARTMENT_OTHER): Payer: Self-pay | Admitting: Internal Medicine

## 2017-08-19 DIAGNOSIS — Z1239 Encounter for other screening for malignant neoplasm of breast: Secondary | ICD-10-CM

## 2017-08-26 ENCOUNTER — Encounter (HOSPITAL_BASED_OUTPATIENT_CLINIC_OR_DEPARTMENT_OTHER): Payer: Self-pay

## 2017-08-26 ENCOUNTER — Ambulatory Visit (HOSPITAL_BASED_OUTPATIENT_CLINIC_OR_DEPARTMENT_OTHER)
Admission: RE | Admit: 2017-08-26 | Discharge: 2017-08-26 | Disposition: A | Discharge status: Home / Self Care | Payer: Medicare HMO | Source: Ambulatory Visit | Attending: Internal Medicine | Admitting: Internal Medicine

## 2017-08-26 DIAGNOSIS — Z1239 Encounter for other screening for malignant neoplasm of breast: Secondary | ICD-10-CM

## 2017-08-26 DIAGNOSIS — Z1231 Encounter for screening mammogram for malignant neoplasm of breast: Secondary | ICD-10-CM | POA: Diagnosis not present

## 2018-05-02 DIAGNOSIS — R6520 Severe sepsis without septic shock: Secondary | ICD-10-CM

## 2018-05-02 DIAGNOSIS — J189 Pneumonia, unspecified organism: Secondary | ICD-10-CM

## 2018-05-02 DIAGNOSIS — J9621 Acute and chronic respiratory failure with hypoxia: Secondary | ICD-10-CM | POA: Diagnosis not present

## 2018-05-02 DIAGNOSIS — L02214 Cutaneous abscess of groin: Secondary | ICD-10-CM

## 2018-05-02 DIAGNOSIS — Y95 Nosocomial condition: Secondary | ICD-10-CM | POA: Diagnosis not present

## 2018-05-02 DIAGNOSIS — I272 Pulmonary hypertension, unspecified: Secondary | ICD-10-CM

## 2018-05-03 DIAGNOSIS — J9621 Acute and chronic respiratory failure with hypoxia: Secondary | ICD-10-CM | POA: Diagnosis not present

## 2018-05-03 DIAGNOSIS — L02214 Cutaneous abscess of groin: Secondary | ICD-10-CM | POA: Diagnosis not present

## 2018-05-03 DIAGNOSIS — J189 Pneumonia, unspecified organism: Secondary | ICD-10-CM | POA: Diagnosis not present

## 2018-05-03 DIAGNOSIS — Y95 Nosocomial condition: Secondary | ICD-10-CM | POA: Diagnosis not present

## 2018-05-04 DIAGNOSIS — L02214 Cutaneous abscess of groin: Secondary | ICD-10-CM | POA: Diagnosis not present

## 2018-05-04 DIAGNOSIS — J189 Pneumonia, unspecified organism: Secondary | ICD-10-CM | POA: Diagnosis not present

## 2018-05-04 DIAGNOSIS — Y95 Nosocomial condition: Secondary | ICD-10-CM | POA: Diagnosis not present

## 2018-05-04 DIAGNOSIS — J9621 Acute and chronic respiratory failure with hypoxia: Secondary | ICD-10-CM | POA: Diagnosis not present

## 2018-05-05 DIAGNOSIS — L02214 Cutaneous abscess of groin: Secondary | ICD-10-CM | POA: Diagnosis not present

## 2018-05-05 DIAGNOSIS — Y95 Nosocomial condition: Secondary | ICD-10-CM | POA: Diagnosis not present

## 2018-05-05 DIAGNOSIS — J9621 Acute and chronic respiratory failure with hypoxia: Secondary | ICD-10-CM | POA: Diagnosis not present

## 2018-05-05 DIAGNOSIS — J189 Pneumonia, unspecified organism: Secondary | ICD-10-CM | POA: Diagnosis not present

## 2018-05-06 DIAGNOSIS — L02214 Cutaneous abscess of groin: Secondary | ICD-10-CM | POA: Diagnosis not present

## 2018-05-06 DIAGNOSIS — Y95 Nosocomial condition: Secondary | ICD-10-CM | POA: Diagnosis not present

## 2018-05-06 DIAGNOSIS — J189 Pneumonia, unspecified organism: Secondary | ICD-10-CM | POA: Diagnosis not present

## 2018-05-06 DIAGNOSIS — J9621 Acute and chronic respiratory failure with hypoxia: Secondary | ICD-10-CM | POA: Diagnosis not present

## 2018-10-28 DEATH — deceased
